# Patient Record
Sex: Male | Born: 1935 | Race: White | Hispanic: No | Marital: Married | Smoking: Current every day smoker
Health system: Southern US, Community
[De-identification: ages and names within clinical notes are randomized; demographics above are authoritative.]

## PROBLEM LIST (undated history)

## (undated) DIAGNOSIS — I10 Essential (primary) hypertension: Secondary | ICD-10-CM

## (undated) DIAGNOSIS — Z87442 Personal history of urinary calculi: Secondary | ICD-10-CM

## (undated) DIAGNOSIS — E785 Hyperlipidemia, unspecified: Secondary | ICD-10-CM

## (undated) DIAGNOSIS — E559 Vitamin D deficiency, unspecified: Secondary | ICD-10-CM

## (undated) HISTORY — DX: Vitamin D deficiency, unspecified: E55.9

## (undated) HISTORY — PX: LITHOTRIPSY: SUR834

## (undated) HISTORY — PX: SKIN CANCER EXCISION: SHX779

## (undated) HISTORY — PX: OTHER SURGICAL HISTORY: SHX169

---

## 1998-08-23 ENCOUNTER — Encounter: Payer: Self-pay | Admitting: Internal Medicine

## 1998-08-23 ENCOUNTER — Ambulatory Visit (HOSPITAL_COMMUNITY): Admission: RE | Admit: 1998-08-23 | Discharge: 1998-08-23 | Payer: Self-pay | Admitting: Internal Medicine

## 1998-12-05 ENCOUNTER — Encounter: Payer: Self-pay | Admitting: Urology

## 1998-12-05 ENCOUNTER — Ambulatory Visit (HOSPITAL_COMMUNITY): Admission: RE | Admit: 1998-12-05 | Discharge: 1998-12-05 | Payer: Self-pay | Admitting: Urology

## 1998-12-07 ENCOUNTER — Ambulatory Visit (HOSPITAL_COMMUNITY): Admission: RE | Admit: 1998-12-07 | Discharge: 1998-12-07 | Payer: Self-pay | Admitting: Urology

## 1998-12-07 ENCOUNTER — Encounter: Payer: Self-pay | Admitting: Urology

## 1999-09-05 ENCOUNTER — Ambulatory Visit (HOSPITAL_COMMUNITY): Admission: RE | Admit: 1999-09-05 | Discharge: 1999-09-05 | Payer: Self-pay | Admitting: Internal Medicine

## 1999-09-05 ENCOUNTER — Encounter: Payer: Self-pay | Admitting: Internal Medicine

## 2000-09-30 ENCOUNTER — Ambulatory Visit (HOSPITAL_COMMUNITY): Admission: RE | Admit: 2000-09-30 | Discharge: 2000-09-30 | Payer: Self-pay | Admitting: Internal Medicine

## 2000-09-30 ENCOUNTER — Encounter: Payer: Self-pay | Admitting: Internal Medicine

## 2001-10-06 ENCOUNTER — Ambulatory Visit (HOSPITAL_COMMUNITY): Admission: RE | Admit: 2001-10-06 | Discharge: 2001-10-06 | Payer: Self-pay | Admitting: Internal Medicine

## 2001-10-06 ENCOUNTER — Encounter: Payer: Self-pay | Admitting: Internal Medicine

## 2002-11-22 ENCOUNTER — Encounter: Payer: Self-pay | Admitting: Internal Medicine

## 2002-11-22 ENCOUNTER — Ambulatory Visit (HOSPITAL_COMMUNITY): Admission: RE | Admit: 2002-11-22 | Discharge: 2002-11-22 | Payer: Self-pay | Admitting: Internal Medicine

## 2003-03-22 ENCOUNTER — Ambulatory Visit (HOSPITAL_COMMUNITY): Admission: RE | Admit: 2003-03-22 | Discharge: 2003-03-22 | Payer: Self-pay | Admitting: Oncology

## 2003-03-22 ENCOUNTER — Encounter: Payer: Self-pay | Admitting: Oncology

## 2003-11-28 ENCOUNTER — Ambulatory Visit (HOSPITAL_COMMUNITY): Admission: RE | Admit: 2003-11-28 | Discharge: 2003-11-28 | Payer: Self-pay | Admitting: Internal Medicine

## 2004-12-03 ENCOUNTER — Ambulatory Visit: Payer: Self-pay | Admitting: Oncology

## 2005-06-13 ENCOUNTER — Ambulatory Visit: Payer: Self-pay | Admitting: Oncology

## 2005-11-28 ENCOUNTER — Ambulatory Visit: Payer: Self-pay | Admitting: Oncology

## 2005-12-03 ENCOUNTER — Ambulatory Visit (HOSPITAL_COMMUNITY): Admission: RE | Admit: 2005-12-03 | Discharge: 2005-12-03 | Payer: Self-pay | Admitting: Internal Medicine

## 2006-01-16 ENCOUNTER — Ambulatory Visit: Payer: Self-pay | Admitting: Oncology

## 2006-02-06 ENCOUNTER — Ambulatory Visit (HOSPITAL_COMMUNITY): Admission: RE | Admit: 2006-02-06 | Discharge: 2006-02-06 | Payer: Self-pay | Admitting: Urology

## 2006-06-23 ENCOUNTER — Ambulatory Visit: Payer: Self-pay | Admitting: Oncology

## 2006-06-26 LAB — COMPREHENSIVE METABOLIC PANEL
ALT: 25 U/L (ref 0–40)
AST: 29 U/L (ref 0–37)
Alkaline Phosphatase: 41 U/L (ref 39–117)
Chloride: 106 mEq/L (ref 96–112)
Creatinine, Ser: 1.04 mg/dL (ref 0.40–1.50)
Total Bilirubin: 0.6 mg/dL (ref 0.3–1.2)

## 2006-06-26 LAB — CBC WITH DIFFERENTIAL/PLATELET
BASO%: 0.6 % (ref 0.0–2.0)
EOS%: 2.5 % (ref 0.0–7.0)
LYMPH%: 38.7 % (ref 14.0–48.0)
MCH: 33 pg (ref 28.0–33.4)
MCHC: 33.8 g/dL (ref 32.0–35.9)
MONO#: 0.5 10*3/uL (ref 0.1–0.9)
MONO%: 8.2 % (ref 0.0–13.0)
Platelets: 141 10*3/uL — ABNORMAL LOW (ref 145–400)
RBC: 4.66 10*6/uL (ref 4.20–5.71)
WBC: 5.6 10*3/uL (ref 4.0–10.0)

## 2006-06-26 LAB — CHCC SMEAR

## 2006-12-11 ENCOUNTER — Ambulatory Visit (HOSPITAL_COMMUNITY): Admission: RE | Admit: 2006-12-11 | Discharge: 2006-12-11 | Payer: Self-pay | Admitting: Internal Medicine

## 2008-01-11 ENCOUNTER — Ambulatory Visit (HOSPITAL_COMMUNITY): Admission: RE | Admit: 2008-01-11 | Discharge: 2008-01-11 | Payer: Self-pay | Admitting: Internal Medicine

## 2009-03-27 ENCOUNTER — Ambulatory Visit (HOSPITAL_COMMUNITY): Admission: RE | Admit: 2009-03-27 | Discharge: 2009-03-27 | Payer: Self-pay | Admitting: Internal Medicine

## 2010-03-23 ENCOUNTER — Ambulatory Visit (HOSPITAL_COMMUNITY): Admission: RE | Admit: 2010-03-23 | Discharge: 2010-03-23 | Payer: Self-pay | Admitting: Internal Medicine

## 2010-08-02 ENCOUNTER — Ambulatory Visit (HOSPITAL_COMMUNITY): Admission: RE | Admit: 2010-08-02 | Discharge: 2010-08-02 | Payer: Self-pay | Admitting: Urology

## 2010-09-24 ENCOUNTER — Ambulatory Visit (HOSPITAL_COMMUNITY): Admission: RE | Admit: 2010-09-24 | Discharge: 2010-09-24 | Payer: Self-pay | Admitting: Urology

## 2011-10-01 IMAGING — CR DG ABDOMEN 1V
1 series · 1 of 1 positions shown · non-contrast
Comparison: 08/02/2010, CT 07/26/2010

CLINICAL DATA: Left-sided kidney stone, pre litho

ABDOMEN - 1 VIEW

[t abdomen supine]
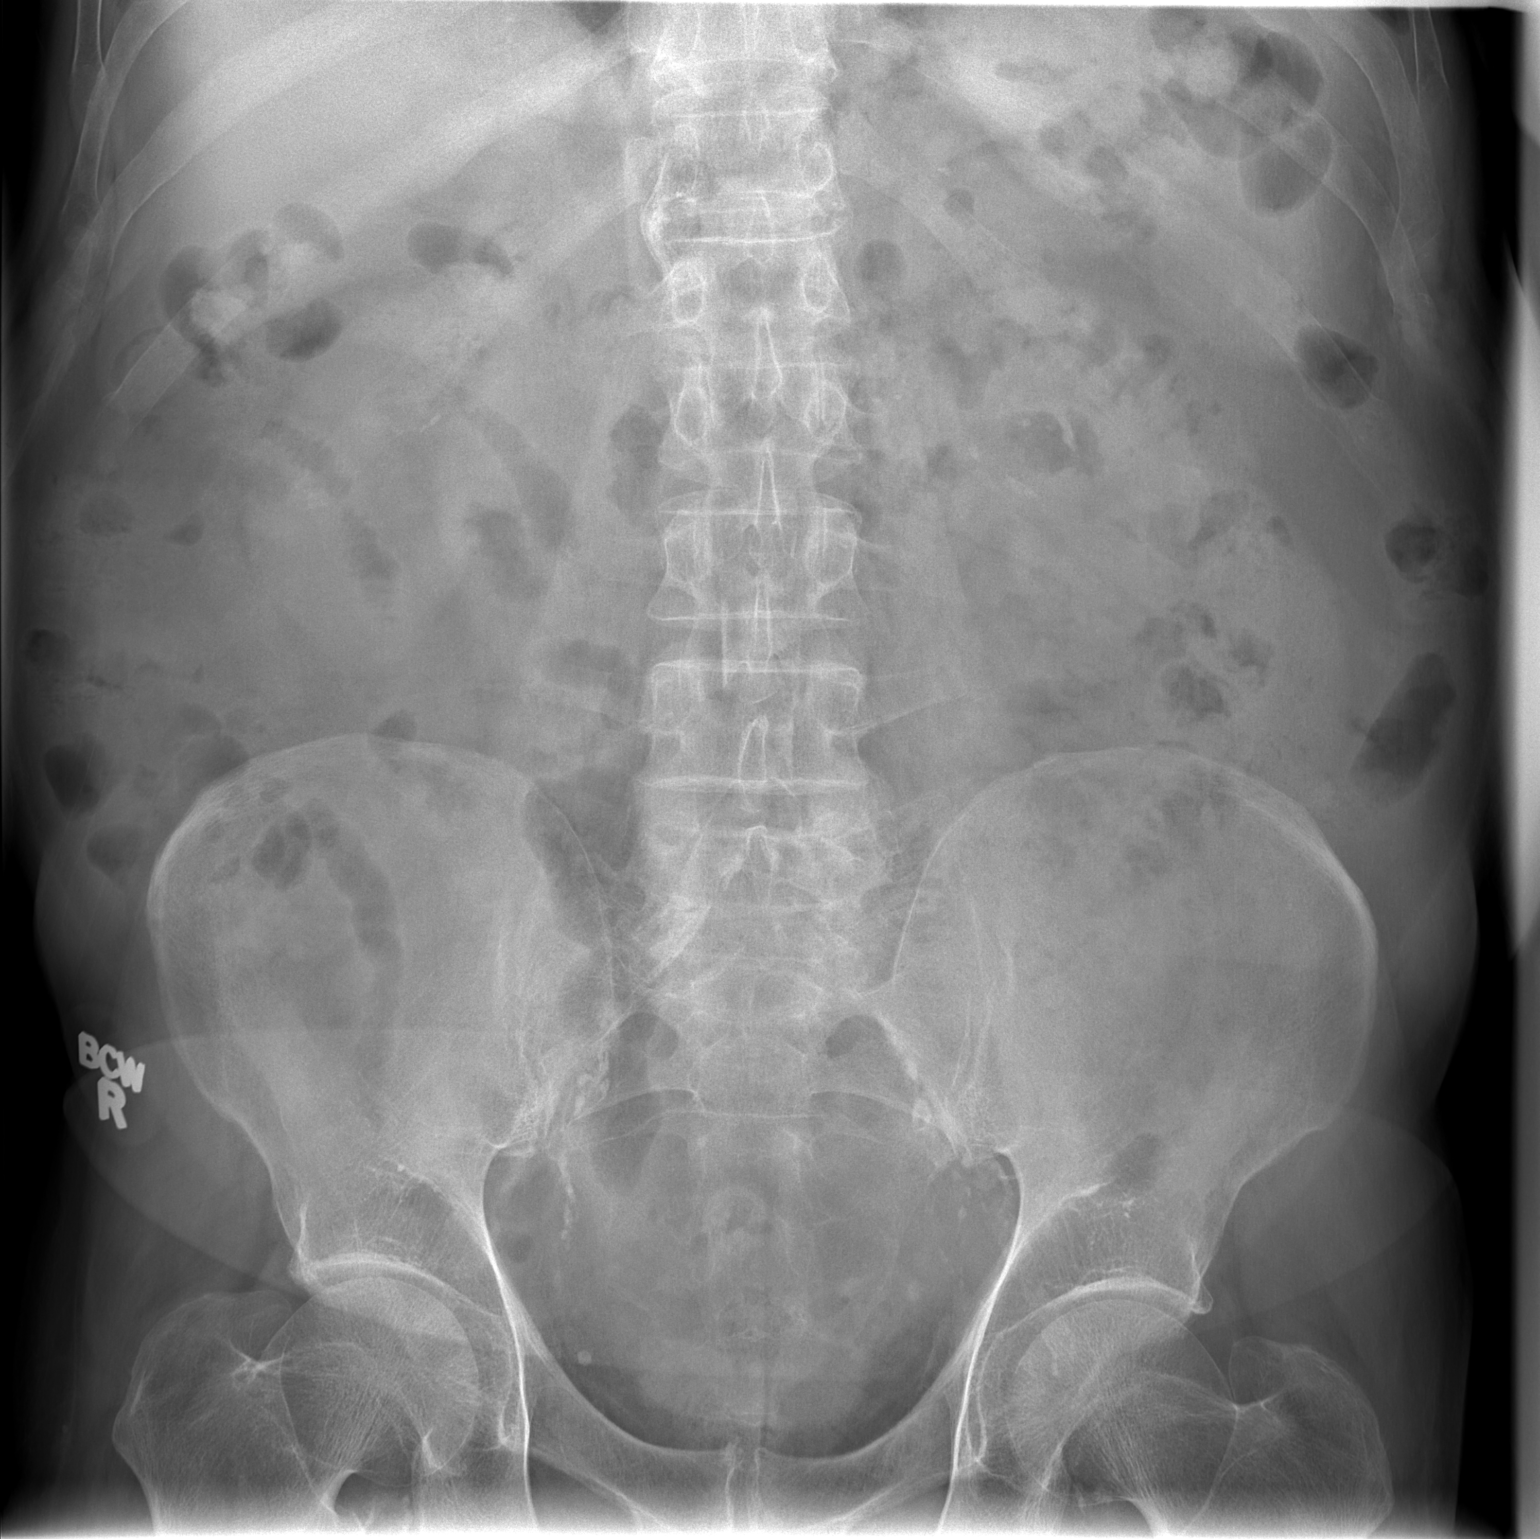

[1 of 1 positions shown; findings below may reference images not displayed]

FINDINGS: Multiple fragmented calcifications in the left pelvis
seen on the prior KUB have resolved may have passed.  This may be
fragmented calculus due to prior lithotripsy.  Bilateral calcified
phleboliths are noted as well as arterial calcification in the
pelvis.

Small bilateral renal calculi are present and unchanged.

Normal bowel gas pattern.
IMPRESSION: Small bilateral renal calculi.

Fragmented calculi in the distal left ureter have passed since the
prior KUB.

## 2012-04-06 ENCOUNTER — Other Ambulatory Visit (HOSPITAL_COMMUNITY): Payer: Self-pay | Admitting: Internal Medicine

## 2012-04-06 ENCOUNTER — Ambulatory Visit (HOSPITAL_COMMUNITY)
Admission: RE | Admit: 2012-04-06 | Discharge: 2012-04-06 | Disposition: A | Discharge status: Home / Self Care | Payer: Medicare Other | Source: Ambulatory Visit | Attending: Internal Medicine | Admitting: Internal Medicine

## 2012-04-06 DIAGNOSIS — I10 Essential (primary) hypertension: Secondary | ICD-10-CM

## 2012-04-06 DIAGNOSIS — F17200 Nicotine dependence, unspecified, uncomplicated: Secondary | ICD-10-CM | POA: Insufficient documentation

## 2013-02-17 ENCOUNTER — Other Ambulatory Visit: Payer: Self-pay | Admitting: Urology

## 2013-02-26 ENCOUNTER — Encounter (HOSPITAL_COMMUNITY): Payer: Self-pay | Admitting: Pharmacy Technician

## 2013-03-03 ENCOUNTER — Encounter (HOSPITAL_COMMUNITY)
Admission: RE | Admit: 2013-03-03 | Discharge: 2013-03-03 | Disposition: A | Discharge status: Home / Self Care | Payer: Medicare Other | Source: Ambulatory Visit | Attending: Urology | Admitting: Urology

## 2013-03-03 ENCOUNTER — Encounter (HOSPITAL_COMMUNITY): Payer: Self-pay

## 2013-03-03 HISTORY — DX: Essential (primary) hypertension: I10

## 2013-03-03 HISTORY — DX: Hyperlipidemia, unspecified: E78.5

## 2013-03-03 HISTORY — DX: Personal history of urinary calculi: Z87.442

## 2013-03-03 LAB — CBC
HCT: 48.6 % (ref 39.0–52.0)
Hemoglobin: 15.9 g/dL (ref 13.0–17.0)
MCHC: 32.7 g/dL (ref 30.0–36.0)
MCV: 101.5 fL — ABNORMAL HIGH (ref 78.0–100.0)
RDW: 13.2 % (ref 11.5–15.5)

## 2013-03-03 LAB — SURGICAL PCR SCREEN: Staphylococcus aureus: NEGATIVE

## 2013-03-03 LAB — BASIC METABOLIC PANEL
CO2: 29 mEq/L (ref 19–32)
Chloride: 104 mEq/L (ref 96–112)
Creatinine, Ser: 1.16 mg/dL (ref 0.50–1.35)
Glucose, Bld: 102 mg/dL — ABNORMAL HIGH (ref 70–99)

## 2013-03-03 NOTE — Patient Instructions (Addendum)
PANKAJ HAACK  03/03/2013                           YOUR PROCEDURE IS SCHEDULED ON: 03/11/13               PLEASE REPORT TO SHORT STAY CENTER AT : 6:30 AM               CALL THIS NUMBER IF ANY PROBLEMS THE DAY OF SURGERY :               [phone]                      REMEMBER:   Do not eat food or drink liquids AFTER MIDNIGHT    Take these medicines the morning of surgery with A SIP OF WATER:  FLOMAX / CRESTOR / MYRBETRIQ / TRICOR Gerald Leitz / FINESTERIDE (DO NOT TAKE ZIAC THE MORNING OF SURGERY)   Do not wear jewelry, make-up   Do not wear lotions, powders, or perfumes.   Do not shave legs or underarms 12 hrs. before surgery (men may shave face)  Do not bring valuables to the hospital.  Contacts, dentures or bridgework may not be worn into surgery.  Leave suitcase in the car. After surgery it may be brought to your room.  For patients admitted to the hospital more than one night, checkout time is 11:00                          The day of discharge.   Patients discharged the day of surgery will not be allowed to drive home                             If going home same day of surgery, must have someone stay with you first                           24 hrs at home and arrange for some one to drive you home from hospital.    Special Instructions:   Please read over the following fact sheets that you were given:               1. MRSA  INFORMATION                      2. [facility] PREPARING FOR SURGERY SHEET                                                X_____________________________________________________________________        Failure to follow these instructions may result in cancellation of your surgery

## 2013-03-10 NOTE — H&P (Signed)
ctive Problems Problems  1. Benign Prostatic Hypertrophy With Urinary Obstruction 600.01 2. Bladder Calculus 594.1 3. Feelings Of Urinary Urgency 788.63 4. Nephrolithiasis 592.0 5. Renal Cyst Bilateral 593.2 6. Urge Incontinence Of Urine 788.31  History of Present Illness  [name] returns today in f/u.  He has BPH with BOO and UUI.  He is currently on tamsulosin, finasteride and myrbetriq 50mg .  He has improved continence.  He might have one day a month when he has increased frequency.  He has nocturia 1-2x.  He also has a history of stones and has had 3 prior ESWL's.  He has had no flank pain or hematuria.   Past Medical History Problems  1. History of  Abdominal Pain Above The Pubic Area (Suprapubic) 789.09 2. History of  Acute Myocardial Infarction V12.59 3. History of  Distal Ureteral Stone On The Left 592.1 4. History of  Hydronephrosis On The Left 591 5. History of  Hypertension 401.9 6. History of  Thrombocytopenia 287.5  Surgical History Problems  1. History of  Lithotripsy 2. History of  Lithotripsy 3. History of  Lithotripsy  Current Meds 1. ALPRAZolam 0.5 MG Oral Tablet; Therapy: 10Sep2007 to 2. Aspirin 81 MG Oral Tablet; Therapy: (Recorded:28Jul2008) to 3. Bisoprolol-Hydrochlorothiazide 5-6.25 MG Oral Tablet; Therapy: 10Jan2011 to 4. Crestor 40 MG Oral Tablet; Therapy: (Recorded:28Jul2008) to 5. Fenofibrate Micronized 134 MG Oral Capsule; Therapy: 07Jul2010 to 6. Finasteride 5 MG Oral Tablet; Therapy: 27Feb2013 to 7. Folic Acid TABS; Therapy: (Recorded:11Jun2012) to 8. Myrbetriq 50 MG Oral Tablet Extended Release 24 Hour; Take 1 tablet daily; Therapy: 30Aug2013  to (Evaluate:26Feb2014)  Requested for: 30Aug2013; Last Rx:30Aug2013 9. Vitamin B-12 TABS; Therapy: (Recorded:28Jan2008) to 10. Vitamin D TABS; Therapy: (Recorded:12Feb2014) to  Allergies Medication  1. No Known Drug Allergies  Family History Problems  1. Family history of  Diabetes Mellitus  V18.0 Denied  2. Family history of  Nephrolithiasis  Social History Problems  1. Alcohol Use moderate 2. Caffeine Use a little 3. Family history of  Death In The Family Father 17, natural 4. Family history of  Death In The Family Mother 68, natural 5. Marital History - Currently Married 6. Tobacco Use V15.82 has smoked for 58 years   Past and social history reviewed and updated.   Review of Systems Genitourinary, constitutional, skin, eye, otolaryngeal, hematologic/lymphatic, cardiovascular, pulmonary, endocrine, musculoskeletal, gastrointestinal, neurological and psychiatric system(s) were reviewed and pertinent findings if present are noted.  Genitourinary: no hematuria.  Gastrointestinal: no nausea, no flank pain and no constipation.  ENT: dry mouth .  Cardiovascular: no chest pain.  Respiratory: no shortness of breath.  Endocrine: no gynecosmastia.  Neurological: no headache.    Vitals Vital Signs [Data Includes: Last 1 Day]  12Feb2014 11:11AM  BMI Calculated: 25.43 BSA Calculated: 1.85 Weight: 162 lb  Blood Pressure: 142 / 84 Temperature: 98 F Heart Rate: 76  Physical Exam Constitutional: Well nourished and well developed . No acute distress.  Pulmonary: No respiratory distress and normal respiratory rhythm and effort.  Cardiovascular: Heart rate and rhythm are normal . No peripheral edema.    Results/Data Urine [Data Includes: Last 1 Day]   12Feb2014  COLOR YELLOW   APPEARANCE CLEAR   SPECIFIC GRAVITY 1.020   pH 7.0   GLUCOSE NEG mg/dL  BILIRUBIN NEG   KETONE NEG mg/dL  BLOOD NEG   PROTEIN NEG mg/dL  UROBILINOGEN 2 mg/dL  NITRITE NEG   LEUKOCYTE ESTERASE NEG    The following images/tracing/specimen were independently visualized:  KUB  today shows some small renal stones but he has a new 12mm calcification over the bladder that is consistent with a bladder stone. The films is otherwise stable in comparision to his prior films. Bladder US today confirms  a 1.2cm bladder stone. The bladder volume is 20cc.  IPSS: The IPSS today is 10     Assessment Assessed  1. Benign Prostatic Hypertrophy With Urinary Obstruction 600.01 2. Feelings Of Urinary Urgency 788.63 3. Urge Incontinence Of Urine 788.31 4. Nephrolithiasis 592.0 5. Bladder Calculus 594.1      He is doing well on current therapy. He has no stone symptoms but he has a new finding of a 12mm bladder stone. .   Plan Bladder Calculus (594.1)  1. BLADDER U/S  Done: 12Feb2014 12:00AM 2. Follow-up Schedule Surgery Office  Follow-up  Requested for: 12Feb2014 Health Maintenance (V70.0)  3. UA With REFLEX  Done: 12Feb2014 10:48AM Urge Incontinence Of Urine (788.31)  4. Myrbetriq 50 MG Oral Tablet Extended Release 24 Hour; Take 1 tablet daily; Therapy: 30Aug2013  to (Evaluate:07Feb2015)  Requested for: 12Feb2014; Last Rx:12Feb2014; Edited     I discussed surgical options for his bladder stone and voiding symptoms. I am going to set him up for cystolithalopaxy and TURP/TUEVP.  I have reviewed the risks of bleeding, infection, strictures, incontinence, persistant voiding symptoms, thrombotic events and anesthetic complications. I have refilled the Myrbetriq.   Discussion/Summary  CC: Dr. Lucky Cowboy

## 2013-03-11 ENCOUNTER — Ambulatory Visit (HOSPITAL_COMMUNITY): Payer: Medicare Other | Admitting: Anesthesiology

## 2013-03-11 ENCOUNTER — Encounter (HOSPITAL_COMMUNITY): Payer: Self-pay | Admitting: Certified Registered Nurse Anesthetist

## 2013-03-11 ENCOUNTER — Encounter (HOSPITAL_COMMUNITY)
Admission: RE | Disposition: A | Discharge status: Home / Self Care | Payer: Self-pay | Source: Ambulatory Visit | Attending: Urology

## 2013-03-11 ENCOUNTER — Observation Stay (HOSPITAL_COMMUNITY)
Admission: RE | Admit: 2013-03-11 | Discharge: 2013-03-12 | Disposition: A | Discharge status: Home / Self Care | Payer: Medicare Other | Source: Ambulatory Visit | Attending: Urology | Admitting: Urology

## 2013-03-11 ENCOUNTER — Encounter (HOSPITAL_COMMUNITY): Payer: Self-pay | Admitting: Anesthesiology

## 2013-03-11 DIAGNOSIS — N320 Bladder-neck obstruction: Secondary | ICD-10-CM | POA: Insufficient documentation

## 2013-03-11 DIAGNOSIS — I10 Essential (primary) hypertension: Secondary | ICD-10-CM | POA: Insufficient documentation

## 2013-03-11 DIAGNOSIS — Z7982 Long term (current) use of aspirin: Secondary | ICD-10-CM | POA: Insufficient documentation

## 2013-03-11 DIAGNOSIS — I252 Old myocardial infarction: Secondary | ICD-10-CM | POA: Insufficient documentation

## 2013-03-11 DIAGNOSIS — N138 Other obstructive and reflux uropathy: Principal | ICD-10-CM | POA: Insufficient documentation

## 2013-03-11 DIAGNOSIS — N281 Cyst of kidney, acquired: Secondary | ICD-10-CM | POA: Insufficient documentation

## 2013-03-11 DIAGNOSIS — N210 Calculus in bladder: Secondary | ICD-10-CM | POA: Insufficient documentation

## 2013-03-11 DIAGNOSIS — R3915 Urgency of urination: Secondary | ICD-10-CM | POA: Insufficient documentation

## 2013-03-11 DIAGNOSIS — N3941 Urge incontinence: Secondary | ICD-10-CM | POA: Insufficient documentation

## 2013-03-11 DIAGNOSIS — N401 Enlarged prostate with lower urinary tract symptoms: Secondary | ICD-10-CM | POA: Insufficient documentation

## 2013-03-11 DIAGNOSIS — Z79899 Other long term (current) drug therapy: Secondary | ICD-10-CM | POA: Insufficient documentation

## 2013-03-11 DIAGNOSIS — N200 Calculus of kidney: Secondary | ICD-10-CM | POA: Insufficient documentation

## 2013-03-11 HISTORY — PX: HOLMIUM LASER APPLICATION: SHX5852

## 2013-03-11 HISTORY — PX: TRANSURETHRAL RESECTION OF PROSTATE: SHX73

## 2013-03-11 HISTORY — PX: CYSTOSCOPY WITH LITHOLAPAXY: SHX1425

## 2013-03-11 SURGERY — CYSTOSCOPY, WITH BLADDER CALCULUS LITHOLAPAXY
Anesthesia: General | Wound class: Clean Contaminated

## 2013-03-11 MED ORDER — VITAMIN D3 25 MCG (1000 UNIT) PO TABS
1000.0000 [IU] | ORAL_TABLET | Freq: Every day | ORAL | Status: DC
  Administered 2013-03-11 – 2013-03-12 (×2): 1000 [IU] via ORAL
  Filled 2013-03-11 (×2): qty 1

## 2013-03-11 MED ORDER — CIPROFLOXACIN HCL 250 MG PO TABS
250.0000 mg | ORAL_TABLET | Freq: Two times a day (BID) | ORAL | Status: DC
  Administered 2013-03-11 – 2013-03-12 (×2): 250 mg via ORAL
  Filled 2013-03-11 (×4): qty 1

## 2013-03-11 MED ORDER — PROPOFOL 10 MG/ML IV BOLUS
INTRAVENOUS | Status: DC | PRN
  Administered 2013-03-11: 140 mg via INTRAVENOUS

## 2013-03-11 MED ORDER — CIPROFLOXACIN IN D5W 400 MG/200ML IV SOLN
INTRAVENOUS | Status: AC
  Filled 2013-03-11: qty 200

## 2013-03-11 MED ORDER — DOCUSATE SODIUM 100 MG PO CAPS
100.0000 mg | ORAL_CAPSULE | Freq: Two times a day (BID) | ORAL | Status: DC
  Administered 2013-03-11 – 2013-03-12 (×2): 100 mg via ORAL
  Filled 2013-03-11 (×4): qty 1

## 2013-03-11 MED ORDER — FENOFIBRATE 54 MG PO TABS
54.0000 mg | ORAL_TABLET | Freq: Every day | ORAL | Status: DC
  Administered 2013-03-12: 54 mg via ORAL
  Filled 2013-03-11: qty 1

## 2013-03-11 MED ORDER — ACETAMINOPHEN 10 MG/ML IV SOLN
INTRAVENOUS | Status: DC | PRN
  Administered 2013-03-11: 1000 mg via INTRAVENOUS

## 2013-03-11 MED ORDER — STERILE WATER FOR IRRIGATION IR SOLN
Status: DC | PRN
  Administered 2013-03-11: 500 mL

## 2013-03-11 MED ORDER — HYDROMORPHONE HCL PF 1 MG/ML IJ SOLN
0.2500 mg | INTRAMUSCULAR | Status: DC | PRN
  Administered 2013-03-11 (×3): 0.5 mg via INTRAVENOUS

## 2013-03-11 MED ORDER — BISACODYL 10 MG RE SUPP: 10.0000 mg | Freq: Every day | RECTAL | Status: DC | PRN

## 2013-03-11 MED ORDER — CIPROFLOXACIN IN D5W 400 MG/200ML IV SOLN
400.0000 mg | INTRAVENOUS | Status: AC
  Administered 2013-03-11: 400 mg via INTRAVENOUS

## 2013-03-11 MED ORDER — KCL IN DEXTROSE-NACL 20-5-0.45 MEQ/L-%-% IV SOLN
INTRAVENOUS | Status: DC
  Administered 2013-03-11 (×2): via INTRAVENOUS
  Filled 2013-03-11 (×3): qty 1000

## 2013-03-11 MED ORDER — MIRABEGRON ER 50 MG PO TB24
50.0000 mg | ORAL_TABLET | Freq: Every day | ORAL | Status: DC
  Filled 2013-03-11: qty 1

## 2013-03-11 MED ORDER — ONDANSETRON HCL 4 MG/2ML IJ SOLN: 4.0000 mg | INTRAMUSCULAR | Status: DC | PRN

## 2013-03-11 MED ORDER — HYDROCODONE-ACETAMINOPHEN 5-325 MG PO TABS
1.0000 | ORAL_TABLET | Freq: Four times a day (QID) | ORAL | Status: DC | PRN
Start: 2013-03-11 — End: 2014-01-14

## 2013-03-11 MED ORDER — HYDROMORPHONE HCL PF 1 MG/ML IJ SOLN
INTRAMUSCULAR | Status: AC
  Filled 2013-03-11: qty 1

## 2013-03-11 MED ORDER — CIPROFLOXACIN HCL 250 MG PO TABS: 250.0000 mg | ORAL_TABLET | Freq: Two times a day (BID) | ORAL | Status: AC

## 2013-03-11 MED ORDER — LACTATED RINGERS IV SOLN
INTRAVENOUS | Status: DC
  Administered 2013-03-11: 1000 mL via INTRAVENOUS
  Administered 2013-03-11 (×2): via INTRAVENOUS

## 2013-03-11 MED ORDER — 0.9 % SODIUM CHLORIDE (POUR BTL) OPTIME
TOPICAL | Status: DC | PRN
  Administered 2013-03-11: 1000 mL

## 2013-03-11 MED ORDER — SODIUM CHLORIDE 0.9 % IR SOLN
Status: DC | PRN
  Administered 2013-03-11: 12000 mL

## 2013-03-11 MED ORDER — PROMETHAZINE HCL 25 MG/ML IJ SOLN: 6.2500 mg | INTRAMUSCULAR | Status: DC | PRN

## 2013-03-11 MED ORDER — BISOPROLOL-HYDROCHLOROTHIAZIDE 5-6.25 MG PO TABS
1.0000 | ORAL_TABLET | Freq: Every morning | ORAL | Status: DC
  Administered 2013-03-12: 1 via ORAL
  Filled 2013-03-11: qty 1

## 2013-03-11 MED ORDER — ACETAMINOPHEN 10 MG/ML IV SOLN
INTRAVENOUS | Status: AC
  Filled 2013-03-11: qty 100

## 2013-03-11 MED ORDER — BISOPROLOL FUMARATE 5 MG PO TABS
5.0000 mg | ORAL_TABLET | Freq: Once | ORAL | Status: AC
  Administered 2013-03-11: 5 mg via ORAL
  Filled 2013-03-11 (×2): qty 1

## 2013-03-11 MED ORDER — HYOSCYAMINE SULFATE 0.125 MG SL SUBL
0.1250 mg | SUBLINGUAL_TABLET | SUBLINGUAL | Status: DC | PRN
  Administered 2013-03-11: 0.125 mg via ORAL
  Filled 2013-03-11: qty 1

## 2013-03-11 MED ORDER — ATORVASTATIN CALCIUM 80 MG PO TABS
80.0000 mg | ORAL_TABLET | Freq: Every day | ORAL | Status: DC
  Filled 2013-03-11: qty 1

## 2013-03-11 MED ORDER — ONDANSETRON HCL 4 MG/2ML IJ SOLN
INTRAMUSCULAR | Status: DC | PRN
  Administered 2013-03-11: 4 mg via INTRAVENOUS

## 2013-03-11 MED ORDER — DOCUSATE SODIUM 100 MG PO CAPS: 100.0000 mg | ORAL_CAPSULE | Freq: Two times a day (BID) | ORAL | Status: DC

## 2013-03-11 MED ORDER — ZOLPIDEM TARTRATE 5 MG PO TABS: 5.0000 mg | ORAL_TABLET | Freq: Every evening | ORAL | Status: DC | PRN

## 2013-03-11 MED ORDER — HYDROCODONE-ACETAMINOPHEN 5-325 MG PO TABS
1.0000 | ORAL_TABLET | ORAL | Status: DC | PRN
  Administered 2013-03-11: 2 via ORAL
  Filled 2013-03-11: qty 2

## 2013-03-11 MED ORDER — HYDROMORPHONE HCL PF 1 MG/ML IJ SOLN: 0.5000 mg | INTRAMUSCULAR | Status: DC | PRN

## 2013-03-11 MED ORDER — FENTANYL CITRATE 0.05 MG/ML IJ SOLN
INTRAMUSCULAR | Status: DC | PRN
  Administered 2013-03-11 (×3): 50 ug via INTRAVENOUS
  Administered 2013-03-11: 100 ug via INTRAVENOUS
  Administered 2013-03-11: 50 ug via INTRAVENOUS

## 2013-03-11 MED ORDER — ACETAMINOPHEN 325 MG PO TABS: 650.0000 mg | ORAL_TABLET | ORAL | Status: DC | PRN

## 2013-03-11 MED ORDER — ALPRAZOLAM 0.5 MG PO TABS: 0.5000 mg | ORAL_TABLET | Freq: Every day | ORAL | Status: DC | PRN

## 2013-03-11 MED ORDER — LACTATED RINGERS IV SOLN: INTRAVENOUS | Status: DC

## 2013-03-11 SURGICAL SUPPLY — 32 items
BAG URINE DRAINAGE (UROLOGICAL SUPPLIES) ×1 IMPLANT
BAG URO CATCHER STRL LF (DRAPE) ×2 IMPLANT
BLADE SURG 15 STRL LF DISP TIS (BLADE) IMPLANT
BLADE SURG 15 STRL SS (BLADE)
CATH FOLEY 2WAY SLVR 30CC 20FR (CATHETERS) ×1 IMPLANT
CATH FOLEY 3WAY 30CC 22FR (CATHETERS) IMPLANT
CATH URET 5FR 28IN OPEN ENDED (CATHETERS) ×1 IMPLANT
CLOTH BEACON ORANGE TIMEOUT ST (SAFETY) ×2 IMPLANT
DRAPE CAMERA CLOSED 9X96 (DRAPES) ×2 IMPLANT
ELECT BUTTON HF 24-28F 2 30DE (ELECTRODE) ×1 IMPLANT
ELECT HF RESECT BIPO 24F 45 ND (CUTTING LOOP) ×1 IMPLANT
ELECT LOOP MED HF 24F 12D (CUTTING LOOP) ×1 IMPLANT
ELECT REM PT RETURN 9FT ADLT (ELECTROSURGICAL)
ELECT RESECT VAPORIZE 12D CBL (ELECTRODE) ×1 IMPLANT
ELECTRODE REM PT RTRN 9FT ADLT (ELECTROSURGICAL) ×1 IMPLANT
GLOVE SURG SS PI 8.0 STRL IVOR (GLOVE) ×2 IMPLANT
GOWN PREVENTION PLUS XLARGE (GOWN DISPOSABLE) ×1 IMPLANT
GOWN STRL REIN XL XLG (GOWN DISPOSABLE) ×2 IMPLANT
HOLDER FOLEY CATH W/STRAP (MISCELLANEOUS) IMPLANT
IV NS IRRIG 3000ML ARTHROMATIC (IV SOLUTION) ×4 IMPLANT
KIT ASPIRATION TUBING (SET/KITS/TRAYS/PACK) ×2 IMPLANT
LASER FIBER DISP (UROLOGICAL SUPPLIES) IMPLANT
LASER FIBER DISP 1000U (UROLOGICAL SUPPLIES) ×1 IMPLANT
MANIFOLD NEPTUNE II (INSTRUMENTS) ×2 IMPLANT
MARKER SKIN DUAL TIP RULER LAB (MISCELLANEOUS) ×1 IMPLANT
PACK CYSTO (CUSTOM PROCEDURE TRAY) ×2 IMPLANT
PROBE EHL 9 FR 470CM (MISCELLANEOUS) IMPLANT
SHIELD EYE BINOCULAR (MISCELLANEOUS) ×1 IMPLANT
SUT ETHILON 3 0 PS 1 (SUTURE) IMPLANT
SYR 30ML LL (SYRINGE) ×1 IMPLANT
SYRINGE IRR TOOMEY STRL 70CC (SYRINGE) ×1 IMPLANT
TUBING CONNECTING 10 (TUBING) ×2 IMPLANT

## 2013-03-11 NOTE — Anesthesia Postprocedure Evaluation (Signed)
Anesthesia Post Note  Patient: Bobby Johnson  Procedure(s) Performed: Procedure(s) (LRB): CYSTOSCOPY WITH LITHOLAPAXY (N/A) HOLMIUM LASER APPLICATION (N/A) TRANSURETHRAL RESECTION OF THE PROSTATE WITH GYRUS INSTRUMENTS (N/A)  Anesthesia type: General  Patient location: PACU  Post pain: Pain level controlled  Post assessment: Post-op Vital signs reviewed  Last Vitals:  Filed Vitals:   03/11/13 1045  BP: 118/71  Pulse: 66  Temp:   Resp: 14    Post vital signs: Reviewed  Level of consciousness: sedated  Complications: No apparent anesthesia complications

## 2013-03-11 NOTE — Anesthesia Preprocedure Evaluation (Addendum)
Anesthesia Evaluation  Patient identified by MRN, date of birth, ID band Patient awake    Reviewed: Allergy & Precautions, H&P , NPO status , Patient's Chart, lab work & pertinent test results  Airway Mallampati: II TM Distance: >3 FB Neck ROM: Full    Dental  (+) Edentulous Upper, Caps and Dental Advisory Given   Pulmonary Current Smoker,  breath sounds clear to auscultation  Pulmonary exam normal       Cardiovascular hypertension, Pt. on home beta blockers + CAD and + Past MI Rhythm:Regular Rate:Normal     Neuro/Psych negative neurological ROS  negative psych ROS   GI/Hepatic negative GI ROS, Neg liver ROS,   Endo/Other  negative endocrine ROS  Renal/GU negative Renal ROS  negative genitourinary   Musculoskeletal negative musculoskeletal ROS (+)   Abdominal   Peds  Hematology negative hematology ROS (+)   Anesthesia Other Findings   Reproductive/Obstetrics negative OB ROS                          Anesthesia Physical Anesthesia Plan  ASA: III  Anesthesia Plan: General   Post-op Pain Management:    Induction: Intravenous  Airway Management Planned: LMA  Additional Equipment:   Intra-op Plan:   Post-operative Plan: Extubation in OR  Informed Consent:   Dental advisory given  Plan Discussed with: CRNA  Anesthesia Plan Comments:         Anesthesia Quick Evaluation

## 2013-03-11 NOTE — Brief Op Note (Signed)
03/11/2013  10:05 AM  PATIENT:  Bobby Johnson  78 y.o. male  PRE-OPERATIVE DIAGNOSIS:  Bladder Stone and Benign Prostatic Hypertrophy with Bladder Outlet Obstruction  POST-OPERATIVE DIAGNOSIS:  Bladder Stone and Benign Prostatic Hypertrophy with Bladder Outlet Obstruction  PROCEDURE:  Procedure(s) with comments: CYSTOSCOPY WITH LITHOLAPAXY (N/A) - 90 mins requested for this case   HOLMIUM LASER APPLICATION (N/A) TRANSURETHRAL RESECTION OF THE PROSTATE WITH GYRUS INSTRUMENTS (N/A)  SURGEON:  Surgeon(s) and Role:    * Anner Crete, MD - Primary  PHYSICIAN ASSISTANT:   ASSISTANTS: none   ANESTHESIA:   general  EBL:  Total I/O In: 1000 [I.V.:1000] Out: -   BLOOD ADMINISTERED:none  DRAINS: Urinary Catheter (Foley)   LOCAL MEDICATIONS USED:  NONE  SPECIMEN:  Source of Specimen:  stone fragments  DISPOSITION OF SPECIMEN:  to family  COUNTS:  YES  TOURNIQUET:  * No tourniquets in log *  DICTATION: .Other Dictation: Dictation Number [id number]  PLAN OF CARE: Admit for overnight observation  PATIENT DISPOSITION:  PACU - hemodynamically stable.   Delay start of Pharmacological VTE agent (>24hrs) due to surgical blood loss or risk of bleeding: yes

## 2013-03-11 NOTE — Progress Notes (Signed)
Patient ID: Bobby Johnson, male   DOB: December 27, 1935, 78 y.o.   MRN: [id number]  Pt without complaints.  Urine light pink.

## 2013-03-11 NOTE — Transfer of Care (Signed)
Immediate Anesthesia Transfer of Care Note  Patient: Bobby Johnson  Procedure(s) Performed: Procedure(s) with comments: CYSTOSCOPY WITH LITHOLAPAXY (N/A) - 90 mins requested for this case   HOLMIUM LASER APPLICATION (N/A) TRANSURETHRAL RESECTION OF THE PROSTATE WITH GYRUS INSTRUMENTS (N/A)  Patient Location: PACU  Anesthesia Type:General  Level of Consciousness: awake and alert   Airway & Oxygen Therapy: Patient Spontanous Breathing and Patient connected to face mask oxygen  Post-op Assessment: Report given to PACU RN and Post -op Vital signs reviewed and stable  Post vital signs: Reviewed and stable  Complications: No apparent anesthesia complications

## 2013-03-11 NOTE — Interval H&P Note (Signed)
History and Physical Interval Note:  03/11/2013 8:39 AM  Bobby Johnson  has presented today for surgery, with the diagnosis of Bladder Stone and Benign Prostatic Hypertrophy with Bladder Outlet Obstruction  The various methods of treatment have been discussed with the patient and family. After consideration of risks, benefits and other options for treatment, the patient has consented to  Procedure(s) with comments: CYSTOSCOPY WITH LITHOLAPAXY (N/A) - 90 mins requested for this case   HOLMIUM LASER APPLICATION (N/A) TRANSURETHRAL RESECTION OF THE PROSTATE WITH GYRUS INSTRUMENTS (N/A) as a surgical intervention .  The patient's history has been reviewed, patient examined, no change in status, stable for surgery.  I have reviewed the patient's chart and labs.  Questions were answered to the patient's satisfaction.     WRENN,JOHN J

## 2013-03-12 ENCOUNTER — Encounter (HOSPITAL_COMMUNITY): Payer: Self-pay | Admitting: Urology

## 2013-03-12 NOTE — Op Note (Signed)
NAME:  SHARBEL, SAHAGUN NO.:  [id number]  MEDICAL RECORD NO.:  [id number]  LOCATION:  1421                         FACILITY:  [facility]  PHYSICIAN:  Excell Seltzer. Annabell Howells, M.D.    DATE OF BIRTH:  03/18/1935  DATE OF PROCEDURE:  03/11/2013 DATE OF DISCHARGE:                              OPERATIVE REPORT   PROCEDURE:  Cystolitholapaxy with holmium laser and transurethral electrovaporization of the prostate.  PREOPERATIVE DIAGNOSES:  Benign prostatic hypertrophy, bladder outlet obstruction, bladder stones.  POSTOPERATIVE DIAGNOSES:  Benign prostatic hypertrophy, bladder outlet obstruction, bladder stones.  SURGEON:  Excell Seltzer. Annabell Howells, M.D.  ANESTHESIA:  General.  SPECIMEN:  Stone fragments, which were given to the patient.  DRAINS:  A 20-French 30 mL Foley catheter.  BLOOD LOSS:  Minimal.  COMPLICATIONS:  None.  INDICATIONS:  Mr. Anthis is a 78 year old white male with BPH, bladder outlet obstruction, and approximately 1-cm bladder stone who has elected cystolitholapaxy and TURP for treatment.  FINDINGS AND PROCEDURE:  He was given Cipro.  He was taken to the operating room where general anesthetic was induced.  He was placed in lithotomy position and fitted with PAS hose.  His perineum and genitalia were prepped with Betadine solution and draped in usual sterile fashion.  Cystoscopy was performed using the 22-French scope and 12-degree lens. Examination revealed a normal urethra.  The external sphincter was intact.  The prostatic urethra was approximately 3 cm in length with bilobar hyperplasia with obstruction and also high bladder neck without a significant middle lobe.  Examination of the bladder revealed moderate trabeculation.  The ureteral orifices were unremarkable.  No tumors were noted, but there was a 1-cm stone in the base of the bladder.  A 1000 micron holmium laser fiber was passed.  The energy was set at 0.5 watts at a frequency of 20 Hz and the stone  was then fragmented readily into small fragments.  At this point, the stone fragments were evacuated through the cystoscope.  The cystoscope was removed and a 28-French continuous flow resectoscope sheath was inserted.  This was fitted with an [place] handle with a gyrus button, a 12-degrees lens, and saline was used as the irrigant.  A transurethral electrovaporization of the prostate was then performed beginning at the bladder neck where the bladder neck fibers were exposed from 5-7 o'clock.  The floor of the prostate was then vaporized out to alongside the verumontanum.  The left lobe of the prostate was vaporized from bladder neck to apex to grade an adequate channel.  This was then repeated on the right side.  Additional anterior tissue and apical tissue was removed as needed and once a generous channel was created, the lumen was fulgurated to ensure hemostasis.  The bladder was inspected and no residual stone under tissue fragments were remaining. Ureteral orifices were intact.  The resectoscope was then removed.  Pressure on the bladder produced an excellent stream.  A 20-French Foley catheter was placed with the aid of a catheter guide.  The balloon was filled with 30 mL of sterile fluid. The catheter was irrigated with clear return and placed to straight drainage.  The patient was taken  down from lithotomy position.  His anesthetic was reversed.  He was moved to the recovery room in a stable condition.  There were no complications.     Excell Seltzer. Annabell Howells, M.D.     JJW/MEDQ  D:  03/11/2013  T:  03/12/2013  Job:  [id number]

## 2013-03-12 NOTE — Progress Notes (Signed)
Discharge to home, wife at bedside, pt alert and oriented, no complaints of any pain or discomfort upon discharge. PIV removed no s/s of infiltration or swelling noted. D/c instructions and follow up appointment done and given to the wife.

## 2013-03-12 NOTE — Discharge Summary (Signed)
Physician Discharge Summary  Patient ID: Bobby Johnson MRN: [id number] DOB/AGE: July 16, 1935 78 y.o.  Admit date: 03/11/2013 Discharge date: 03/12/2013  Admission Diagnoses:  BPH with BOO and a bladder stone  Discharge Diagnoses: Same Active Problems: BPH with BOO and Bladder stone   Discharged Condition: good  Hospital Course: Mr. Hoheisel had a cystolithalopaxy and TUEVP yesterday.  He is doing well without complaints.  His foley was removed this morning and he has voided a small amount of pink urine.   Consults: None  Significant Diagnostic Studies: None  Treatments: surgery: as above  Discharge Exam: Blood pressure 106/69, pulse 67, temperature 97.9 F (36.6 C), temperature source Oral, resp. rate 16, height 5\' 8"  (1.727 m), weight 77.16 kg (170 lb 1.7 oz), SpO2 95.00%. General appearance: alert and no distress  Disposition:  Home   Discharge Orders   Future Orders Complete By Expires     Discontinue IV  As directed         Medication List    STOP taking these medications       finasteride 5 MG tablet  Commonly known as:  PROSCAR     MYRBETRIQ 50 MG Tb24  Generic drug:  mirabegron ER     tamsulosin 0.4 MG Caps  Commonly known as:  FLOMAX      TAKE these medications       ALPRAZolam 0.5 MG tablet  Commonly known as:  XANAX  Take 0.5 mg by mouth daily as needed for anxiety.     aspirin EC 81 MG tablet  Take 81 mg by mouth daily.     bisoprolol-hydrochlorothiazide 5-6.25 MG per tablet  Commonly known as:  ZIAC  Take 1 tablet by mouth every morning.     cholecalciferol 1000 UNITS tablet  Commonly known as:  VITAMIN D  Take 1,000 Units by mouth daily.     ciprofloxacin 250 MG tablet  Commonly known as:  CIPRO  Take 1 tablet (250 mg total) by mouth 2 (two) times daily.     docusate sodium 100 MG capsule  Commonly known as:  COLACE  Take 1 capsule (100 mg total) by mouth 2 (two) times daily.     fenofibrate micronized 134 MG capsule  Commonly known as:   LOFIBRA  Take 134 mg by mouth daily before breakfast.     HYDROcodone-acetaminophen 5-325 MG per tablet  Commonly known as:  NORCO  Take 1 tablet by mouth every 6 (six) hours as needed for pain.     rosuvastatin 40 MG tablet  Commonly known as:  CRESTOR  Take 40 mg by mouth every morning.           Follow-up Information   Follow up with Anner Crete, MD. Call in 3 weeks. (If you don't already have an appt)    Contact information:   [address] [place] [address] [phone]       Signed: Anner Crete 03/12/2013, 6:50 AM

## 2013-03-12 NOTE — Progress Notes (Addendum)
Foley removed this morning per Doctor order. Pt due to void at this time. Pt aware of the 3 string bottles in bathroom and verbalized understanding of collection procedure. Will follow up with day shift nurse, and continue to monitor patient per Doctors order and unit protocol.   Patient voided a small amount of pinkish colored urine. Dr. Annabell Howells in to see patient and placed new orders to discontinue IV fluids and to discharge patient. Will follow up with day shift nurse.

## 2013-03-12 NOTE — Care Management Note (Signed)
    Page 1 of 1   03/12/2013     11:30:03 AM   CARE MANAGEMENT NOTE 03/12/2013  Patient:  Bobby Johnson, Bobby Johnson   Account Number:  [id number]  Date Initiated:  03/12/2013  Documentation initiated by:  Lanier Clam  Subjective/Objective Assessment:   ADMITTED W/BENIGN PROSTATE HYPERTROPHY.     Action/Plan:   FROM HOME.HAS PCP,PHARMACY.   Anticipated DC Date:  03/12/2013   Anticipated DC Plan:  HOME/SELF CARE      DC Planning Services  CM consult      Choice offered to / List presented to:             Status of service:  Completed, signed off Medicare Important Message given?   (If response is "NO", the following Medicare IM given date fields will be blank) Date Medicare IM given:   Date Additional Medicare IM given:    Discharge Disposition:  HOME/SELF CARE  Per UR Regulation:  Reviewed for med. necessity/level of care/duration of stay  If discussed at Long Length of Stay Meetings, dates discussed:    Comments:  03/12/13 Krystol Rocco RN,BSN NCM [phone] S/P CYSTOLITHALOPAXY.

## 2013-04-08 ENCOUNTER — Ambulatory Visit (HOSPITAL_COMMUNITY)
Admission: RE | Admit: 2013-04-08 | Discharge: 2013-04-08 | Disposition: A | Discharge status: Home / Self Care | Payer: Medicare Other | Source: Ambulatory Visit | Attending: Internal Medicine | Admitting: Internal Medicine

## 2013-04-08 ENCOUNTER — Other Ambulatory Visit (HOSPITAL_COMMUNITY): Payer: Self-pay | Admitting: Internal Medicine

## 2013-04-08 DIAGNOSIS — F17200 Nicotine dependence, unspecified, uncomplicated: Secondary | ICD-10-CM | POA: Insufficient documentation

## 2013-04-08 DIAGNOSIS — I10 Essential (primary) hypertension: Secondary | ICD-10-CM

## 2013-04-13 IMAGING — CR DG CHEST 2V
2 series · 2 of 2 positions shown · non-contrast
Comparison: 03/23/2010

CLINICAL DATA: Hypertension.  Smoker.

CHEST - 2 VIEW

[view not recorded (1 of 2)]
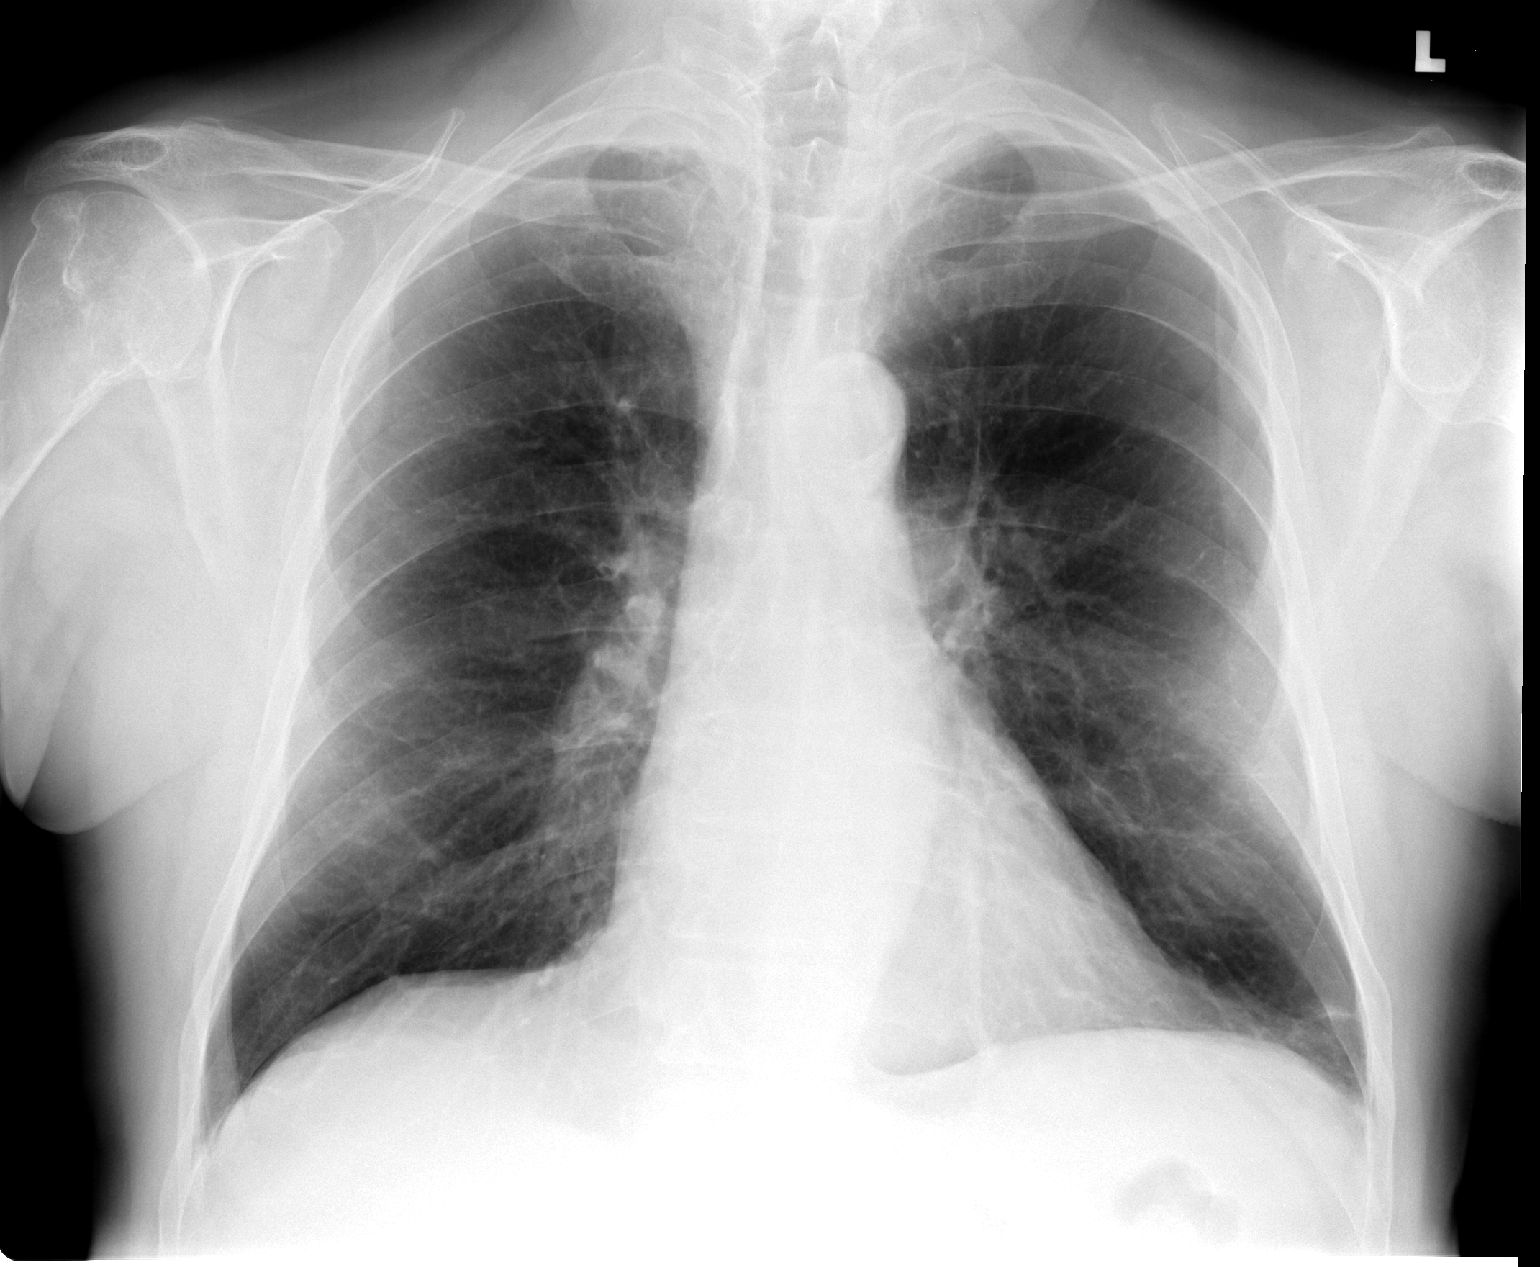

[view not recorded (2 of 2)]
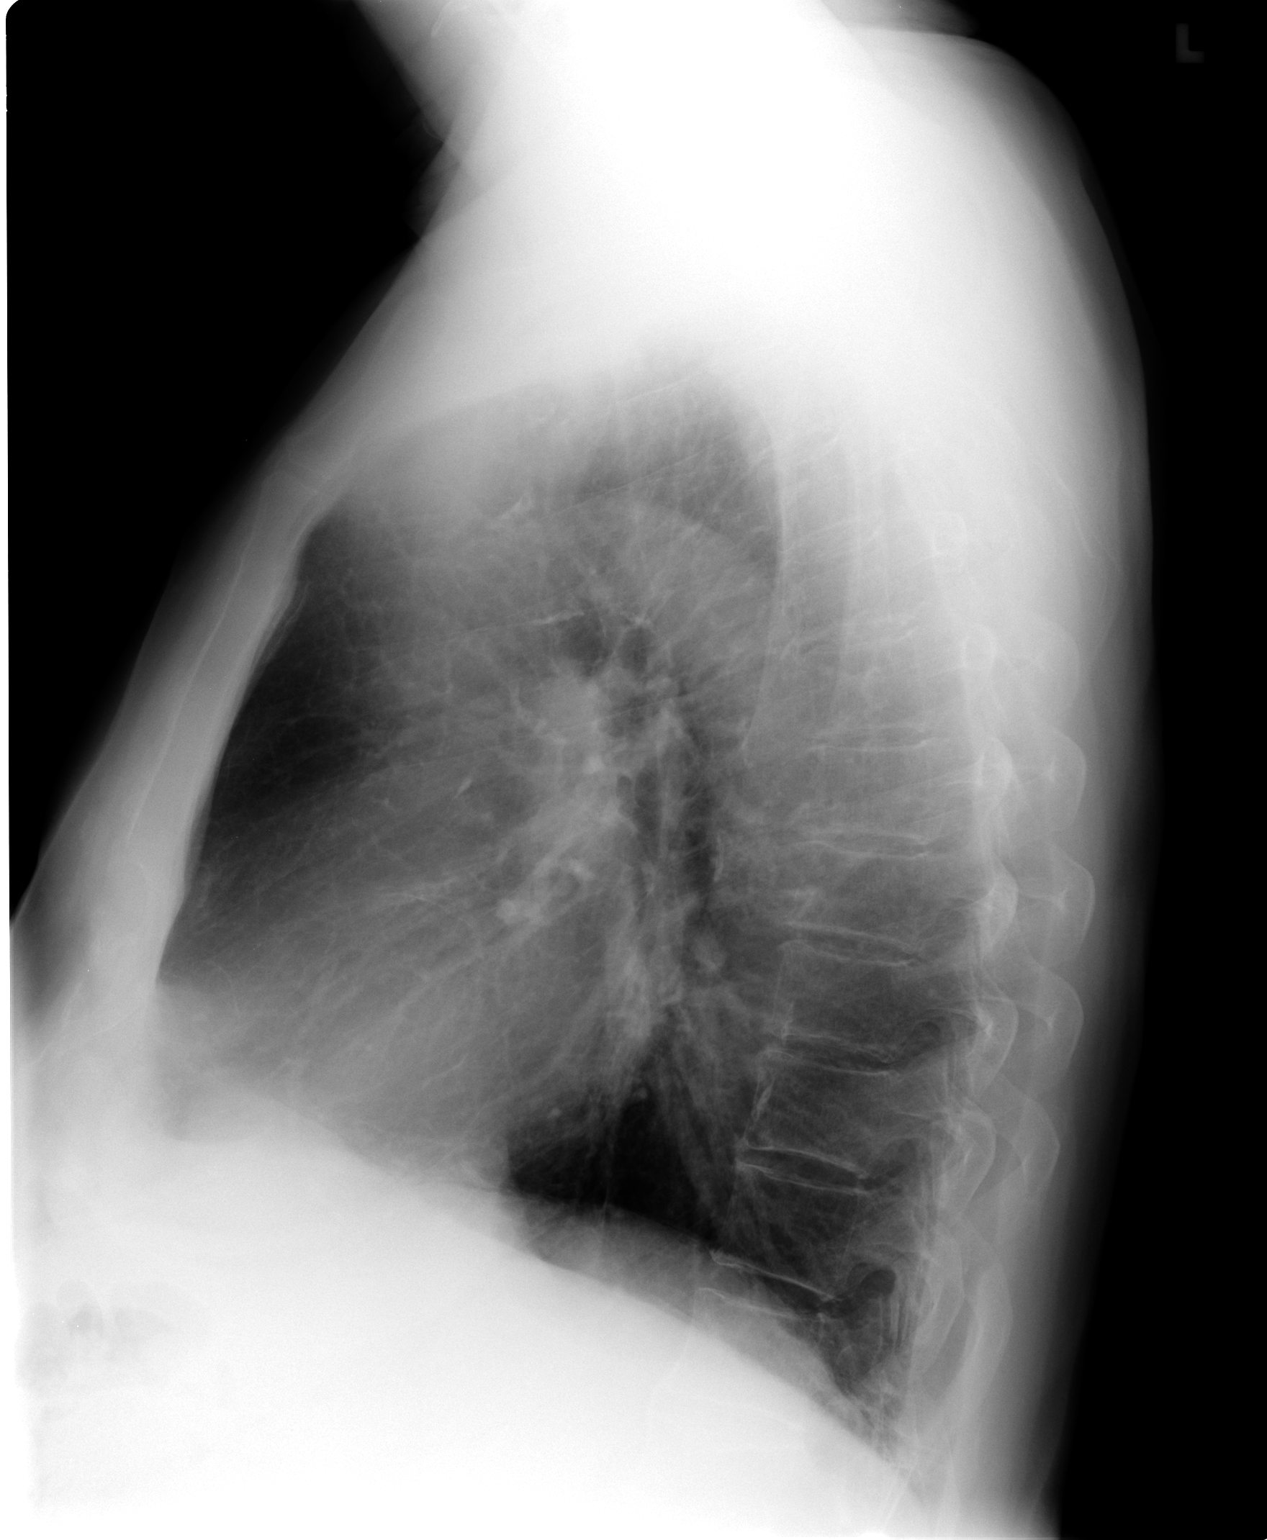

[2 of 2 positions shown; findings below may reference images not displayed]

FINDINGS: Mild pulmonary hyperinflation appears stable as well as
mild scarring at the left lung base.  No evidence of pulmonary
infiltrate or edema.  No evidence of pleural effusion.  Heart size
is within normal limits.  No mass or lymphadenopathy identified.
IMPRESSION: Stable exam.  No active disease.

## 2014-01-04 ENCOUNTER — Other Ambulatory Visit: Payer: Self-pay | Admitting: Internal Medicine

## 2014-01-04 ENCOUNTER — Other Ambulatory Visit: Payer: Self-pay | Admitting: Emergency Medicine

## 2014-01-04 MED ORDER — FENOFIBRATE MICRONIZED 134 MG PO CAPS: 134.0000 mg | ORAL_CAPSULE | Freq: Every day | ORAL | Status: DC

## 2014-01-09 ENCOUNTER — Encounter: Payer: Self-pay | Admitting: *Deleted

## 2014-01-10 ENCOUNTER — Other Ambulatory Visit: Payer: Self-pay

## 2014-01-10 MED ORDER — FENOFIBRATE MICRONIZED 134 MG PO CAPS: 134.0000 mg | ORAL_CAPSULE | Freq: Every day | ORAL | Status: DC

## 2014-01-14 ENCOUNTER — Ambulatory Visit (INDEPENDENT_AMBULATORY_CARE_PROVIDER_SITE_OTHER): Payer: Medicare Other | Admitting: Emergency Medicine

## 2014-01-14 ENCOUNTER — Encounter: Payer: Self-pay | Admitting: Emergency Medicine

## 2014-01-14 VITALS — BP 122/70 | HR 64 | Temp 98.2°F | Resp 16 | Ht 67.5 in | Wt 170.0 lb

## 2014-01-14 DIAGNOSIS — I10 Essential (primary) hypertension: Secondary | ICD-10-CM | POA: Insufficient documentation

## 2014-01-14 DIAGNOSIS — R7309 Other abnormal glucose: Secondary | ICD-10-CM

## 2014-01-14 DIAGNOSIS — E559 Vitamin D deficiency, unspecified: Secondary | ICD-10-CM

## 2014-01-14 DIAGNOSIS — E785 Hyperlipidemia, unspecified: Secondary | ICD-10-CM

## 2014-01-14 DIAGNOSIS — E782 Mixed hyperlipidemia: Secondary | ICD-10-CM

## 2014-01-14 DIAGNOSIS — C4490 Unspecified malignant neoplasm of skin, unspecified: Secondary | ICD-10-CM | POA: Insufficient documentation

## 2014-01-14 DIAGNOSIS — Z87442 Personal history of urinary calculi: Secondary | ICD-10-CM | POA: Insufficient documentation

## 2014-01-14 LAB — CBC WITH DIFFERENTIAL/PLATELET
BASOS PCT: 0 % (ref 0–1)
Basophils Absolute: 0 10*3/uL (ref 0.0–0.1)
EOS ABS: 0.2 10*3/uL (ref 0.0–0.7)
EOS PCT: 4 % (ref 0–5)
HEMATOCRIT: 46 % (ref 39.0–52.0)
HEMOGLOBIN: 15.7 g/dL (ref 13.0–17.0)
Lymphocytes Relative: 26 % (ref 12–46)
Lymphs Abs: 1.6 10*3/uL (ref 0.7–4.0)
MCH: 33.8 pg (ref 26.0–34.0)
MCHC: 34.1 g/dL (ref 30.0–36.0)
MCV: 99.1 fL (ref 78.0–100.0)
MONO ABS: 0.6 10*3/uL (ref 0.1–1.0)
MONOS PCT: 9 % (ref 3–12)
NEUTROS PCT: 61 % (ref 43–77)
Neutro Abs: 3.6 10*3/uL (ref 1.7–7.7)
Platelets: 156 10*3/uL (ref 150–400)
RBC: 4.64 MIL/uL (ref 4.22–5.81)
RDW: 13.6 % (ref 11.5–15.5)
WBC: 6 10*3/uL (ref 4.0–10.5)

## 2014-01-14 LAB — LIPID PANEL
CHOL/HDL RATIO: 1.9 ratio
Cholesterol: 138 mg/dL (ref 0–200)
HDL: 73 mg/dL (ref >39)
LDL Cholesterol: 50 mg/dL (ref 0–99)
Triglycerides: 75 mg/dL (ref <150)
VLDL: 15 mg/dL (ref 0–40)

## 2014-01-14 LAB — HEMOGLOBIN A1C
HEMOGLOBIN A1C: 5.5 % (ref <5.7)
Mean Plasma Glucose: 111 mg/dL (ref <117)

## 2014-01-14 LAB — HEPATIC FUNCTION PANEL
ALBUMIN: 4 g/dL (ref 3.5–5.2)
ALK PHOS: 40 U/L (ref 39–117)
ALT: 15 U/L (ref 0–53)
AST: 27 U/L (ref 0–37)
BILIRUBIN INDIRECT: 0.6 mg/dL (ref 0.0–0.9)
BILIRUBIN TOTAL: 0.8 mg/dL (ref 0.3–1.2)
Bilirubin, Direct: 0.2 mg/dL (ref 0.0–0.3)
Total Protein: 6.4 g/dL (ref 6.0–8.3)

## 2014-01-14 LAB — BASIC METABOLIC PANEL WITH GFR
BUN: 15 mg/dL (ref 6–23)
CALCIUM: 9.5 mg/dL (ref 8.4–10.5)
CO2: 28 mEq/L (ref 19–32)
Chloride: 106 mEq/L (ref 96–112)
Creat: 1.11 mg/dL (ref 0.50–1.35)
GFR, EST AFRICAN AMERICAN: 74 mL/min
GFR, Est Non African American: 64 mL/min
GLUCOSE: 91 mg/dL (ref 70–99)
Potassium: 4.1 mEq/L (ref 3.5–5.3)
SODIUM: 144 meq/L (ref 135–145)

## 2014-01-14 NOTE — Patient Instructions (Signed)
Squamous Cell Carcinoma  Squamous cell carcinoma is the second most common form of skin cancer. It begins in the squamous cells in the outer layer of the skin (epidermis).  CAUSES  Ultraviolet light exposure is the most common cause of squamous cell carcinoma. This may come from sunlight or tanning beds. Squamous cell carcinoma is most common in sun-exposed areas like the face, neck, arms, and hands. However, squamous cell carcinoma can occur anywhere on the body, including the lips, inside the mouth, the legs, sites of long-term (chronic) scarring, and the anus.  Other causes of squamous cell carcinoma can include:  Exposure to arsenic.  Exposure to radiation.  Exposure to toxic tars and oils. RISK FACTORS Factors that increase your risk for squamous cell carcinoma include:  Having fair skin.  Being middle-aged or elderly.  Heavy sun exposure, especially during childhood.  Repeated sunburns.  Use of tanning beds.  A weakened immune system. This includes patients who have received a transplant and patients with human immunodeficiency virus (HIV) or acquired immunodeficency syndrome (AIDS).  Human papillomavirus infection.  Conditions that cause chronic scarring. This can include burn scars, chronic ulcers, heat (thermal) injuries, and radiation.  Exposure to psoralen plus ultraviolet A light therapy.  Exposure to chemical carcinogens, such as tar, soot, and arsenic.  Chronic, inflammatory conditions such as lupus, lichen planus, or lichen sclerosus.  Chronic infections, such as infections of the bone (osteomyelitis).  Smoking. SYMPTOMS  Squamous cell carcinoma often starts as small, skin-colored (pink or brown) sandpaper-like growths. These growths are called solar keratoses or actinic keratoses. These growths are often more easily felt than seen.  DIAGNOSIS  Your caregiver may be able to tell what is wrong by doing a physical exam. Often, a tissue sample is also taken. The  tissue sample is examined under a microscope.  TREATMENT  The treatment for squamous cell carcinoma depends on the size and location of the tumors, as well as your overall health. Possible treatments include:   Mohs surgery. This is a procedure done by a skin doctor (dermatologist or Mohs surgeon) in his or her office. The cancerous cells are removed layer by layer.  Laser surgery to remove the tumor.  Freezing the tumor with liquid nitrogen (cryosurgery).  Radiation. This may be used for tumors on the face.  Electrodesiccation and curettage. This involves alternately scraping and burning the tumor, using an electric current to control bleeding. If treated soon enough, squamous cell carcinoma rarely spreads to other areas of the body (metastasizes). If left untreated, however, squamous cell carcinoma will destroy the nearby tissues. This can result in the loss of a nose or ear. PREVENTION  Avoid the sun between 10:00 am and 4:00 pm when it is the strongest.  Use a sunscreen or sunblock with sun protection factor 30 or greater.  Apply sunscreen at least 30 minutes before exposure to the sun.  Reapply sunscreen every 2 to 4 hours while you are outside, after swimming, and after excessive sweating.  Always wear protective hats, clothing, and sunglasses with ultraviolet protection.  Avoid tanning beds. HOME CARE INSTRUCTIONS   Avoid unprotected sun exposure.  Do not smoke.  Follow your caregiver's instructions for self-exams. Look for new growths or changes in your skin.  Keep all follow-up appointments as directed by your caregiver. SEEK MEDICAL CARE IF:   You notice any new growths or changes in your skin.  You have had a squamous cell carcinoma tumor removed and you notice a new growth in   the same location. Document Released: 06/22/2003 Document Revised: 03/09/2012 Document Reviewed: 09/09/2011 [employer] Patient Information 2014 [employer].  

## 2014-01-14 NOTE — Progress Notes (Signed)
Subjective:    Patient ID: Bobby Johnson, male    DOB: [birth year], 79 y.o.   MRN: [id number]  HPI Comments: 79 yo male presents for 3 month F/U for HTN, Cholesterol, Pre-Dm, D. deficient LAST LABS T 122 H 70 L 39 A1C 5.8 MAG 1.8 D 46 INSULIN 11 He is keeping busy but denies cardio. He eats descent except for holidays. He notes BP has been good at home. He is doing well overall.   He had skin exam at Derm 6 months ago and WNL. He has noticed change in spot on left cheek over last 2 weeks.  Hypertension  Hyperlipidemia   Current Outpatient Prescriptions on File Prior to Visit  Medication Sig Dispense Refill  . ALPRAZolam (XANAX) 0.5 MG tablet Take 0.5 mg by mouth daily as needed for anxiety.      Marland Kitchen aspirin EC 81 MG tablet Take 81 mg by mouth daily.      . bisoprolol-hydrochlorothiazide (ZIAC) 5-6.25 MG per tablet TAKE ONE TABLET BY MOUTH EVERY DAY  90 tablet  1  . cholecalciferol (VITAMIN D) 1000 UNITS tablet Take 1,000 Units by mouth daily.      . fenofibrate micronized (LOFIBRA) 134 MG capsule Take 1 capsule (134 mg total) by mouth daily before breakfast.  90 capsule  3  . rosuvastatin (CRESTOR) 40 MG tablet Take 40 mg by mouth daily. Takes 1/2 = 20 mg       No current facility-administered medications on file prior to visit.   ALLERGIES Doxazosin; Fish oil; Flax seeds; and Lipitor  Past Medical History  Diagnosis Date  . Hypertension   . Hyperlipidemia   . Myocardial infarction 1986  . History of skin cancer 1995  . Bladder stone   . Enlarged prostate   . Frequency of urination   . History of kidney stones   . Cancer     SKIN   . Vitamin D deficiency       Review of Systems  Skin: Positive for color change.  All other systems reviewed and are negative.   BP 122/70  Pulse 64  Temp(Src) 98.2 F (36.8 C) (Temporal)  Resp 16  Ht 5' 7.5" (1.715 m)  Wt 170 lb (77.111 kg)  BMI 26.22 kg/m2     Objective:   Physical Exam  Nursing note and vitals  reviewed. Constitutional: He is oriented to person, place, and time. He appears well-developed and well-nourished.  HENT:  Head: Normocephalic and atraumatic.  Right Ear: External ear normal.  Left Ear: External ear normal.  Nose: Nose normal.  Eyes: Conjunctivae and EOM are normal.  Neck: Normal range of motion. Neck supple. No JVD present. No thyromegaly present.  Cardiovascular: Normal rate, regular rhythm, normal heart sounds and intact distal pulses.   Pulmonary/Chest: Effort normal and breath sounds normal.  Abdominal: Soft. Bowel sounds are normal. He exhibits no distension and no mass. There is no tenderness. There is no rebound and no guarding.  Musculoskeletal: Normal range of motion. He exhibits no edema and no tenderness.  Lymphadenopathy:    He has no cervical adenopathy.  Neurological: He is alert and oriented to person, place, and time. He has normal reflexes. No cranial nerve deficit. Coordination normal.  Skin: Skin is warm and dry.     Psychiatric: He has a normal mood and affect. His behavior is normal. Judgment and thought content normal.          Assessment & Plan:  1.  3 month  F/U for HTN, Cholesterol, Pre-Dm, D. Deficient. Needs healthy diet, cardio QD and obtain healthy weight. Check Labs, Check BP if >130/80 call office 2. ? SQ Cell Carcinoma/ left cheek- If no change after 1 week pt w/c dermatologist

## 2014-01-15 LAB — INSULIN, FASTING: Insulin fasting, serum: 20 u[IU]/mL (ref 3–28)

## 2014-04-06 ENCOUNTER — Other Ambulatory Visit: Payer: Self-pay | Admitting: Emergency Medicine

## 2014-04-06 MED ORDER — ALPRAZOLAM 0.5 MG PO TABS
0.5000 mg | ORAL_TABLET | Freq: Every day | ORAL | Status: DC | PRN
Start: 2014-04-06 — End: 2014-07-18

## 2014-04-15 ENCOUNTER — Encounter: Payer: Self-pay | Admitting: Internal Medicine

## 2014-04-15 ENCOUNTER — Ambulatory Visit (INDEPENDENT_AMBULATORY_CARE_PROVIDER_SITE_OTHER): Payer: Medicare Other | Admitting: Internal Medicine

## 2014-04-15 VITALS — BP 138/84 | HR 72 | Temp 97.9°F | Resp 16 | Ht 67.5 in | Wt 168.6 lb

## 2014-04-15 DIAGNOSIS — R7309 Other abnormal glucose: Secondary | ICD-10-CM

## 2014-04-15 DIAGNOSIS — Z789 Other specified health status: Secondary | ICD-10-CM

## 2014-04-15 DIAGNOSIS — R7303 Prediabetes: Secondary | ICD-10-CM | POA: Insufficient documentation

## 2014-04-15 DIAGNOSIS — Z1331 Encounter for screening for depression: Secondary | ICD-10-CM

## 2014-04-15 DIAGNOSIS — Z125 Encounter for screening for malignant neoplasm of prostate: Secondary | ICD-10-CM

## 2014-04-15 DIAGNOSIS — Z1212 Encounter for screening for malignant neoplasm of rectum: Secondary | ICD-10-CM

## 2014-04-15 DIAGNOSIS — Z79899 Other long term (current) drug therapy: Secondary | ICD-10-CM

## 2014-04-15 DIAGNOSIS — E559 Vitamin D deficiency, unspecified: Secondary | ICD-10-CM | POA: Insufficient documentation

## 2014-04-15 DIAGNOSIS — E785 Hyperlipidemia, unspecified: Secondary | ICD-10-CM

## 2014-04-15 DIAGNOSIS — I10 Essential (primary) hypertension: Secondary | ICD-10-CM

## 2014-04-15 LAB — CBC WITH DIFFERENTIAL/PLATELET
Basophils Absolute: 0 10*3/uL (ref 0.0–0.1)
Basophils Relative: 0 % (ref 0–1)
EOS PCT: 2 % (ref 0–5)
Eosinophils Absolute: 0.1 10*3/uL (ref 0.0–0.7)
HEMATOCRIT: 47.4 % (ref 39.0–52.0)
HEMOGLOBIN: 16.1 g/dL (ref 13.0–17.0)
LYMPHS ABS: 1.8 10*3/uL (ref 0.7–4.0)
LYMPHS PCT: 31 % (ref 12–46)
MCH: 33.5 pg (ref 26.0–34.0)
MCHC: 34 g/dL (ref 30.0–36.0)
MCV: 98.5 fL (ref 78.0–100.0)
MONO ABS: 0.6 10*3/uL (ref 0.1–1.0)
Monocytes Relative: 11 % (ref 3–12)
Neutro Abs: 3.2 10*3/uL (ref 1.7–7.7)
Neutrophils Relative %: 56 % (ref 43–77)
Platelets: 153 10*3/uL (ref 150–400)
RBC: 4.81 MIL/uL (ref 4.22–5.81)
RDW: 13.6 % (ref 11.5–15.5)
WBC: 5.7 10*3/uL (ref 4.0–10.5)

## 2014-04-15 LAB — HEMOGLOBIN A1C
Hgb A1c MFr Bld: 5.6 % (ref <5.7)
Mean Plasma Glucose: 114 mg/dL (ref <117)

## 2014-04-15 IMAGING — CR DG CHEST 2V
2 series · 2 of 2 positions shown · non-contrast
Comparison: April 06, 2012.

CLINICAL DATA: Hypertension.

CHEST - 2 VIEW

[view not recorded (1 of 2)]
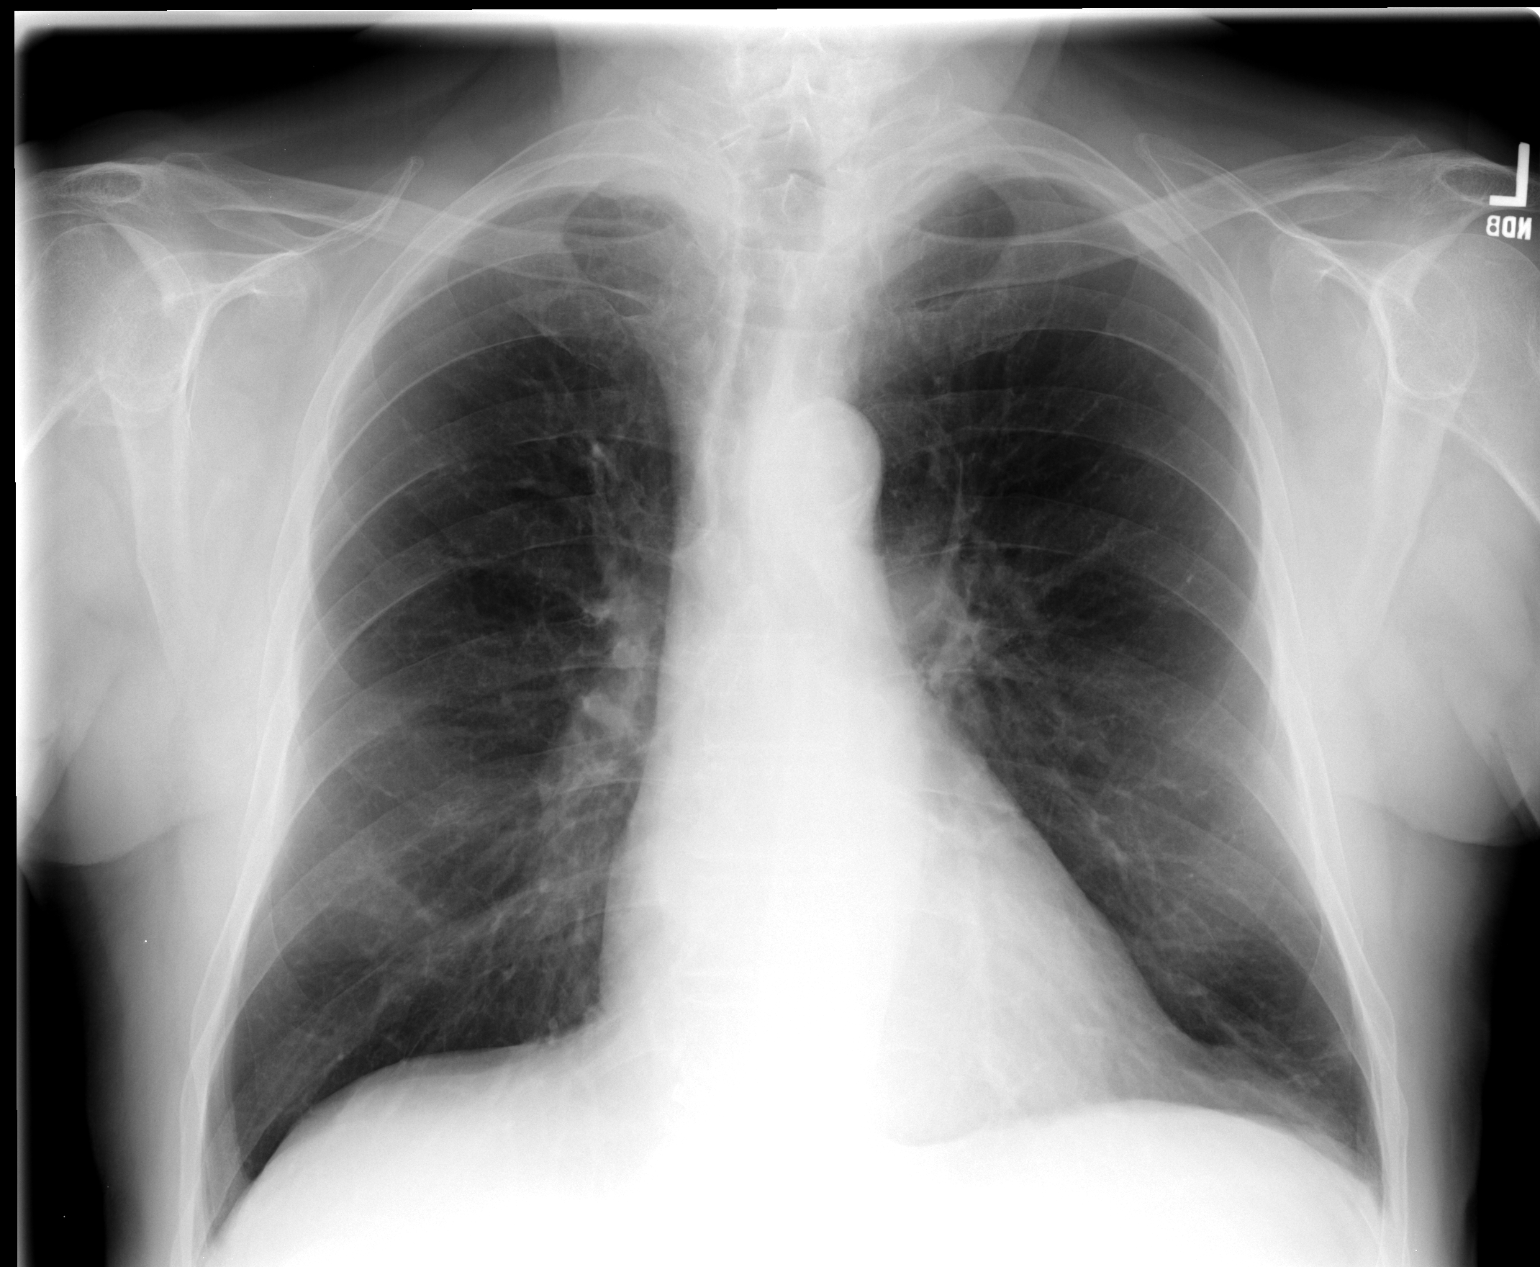

[view not recorded (2 of 2)]
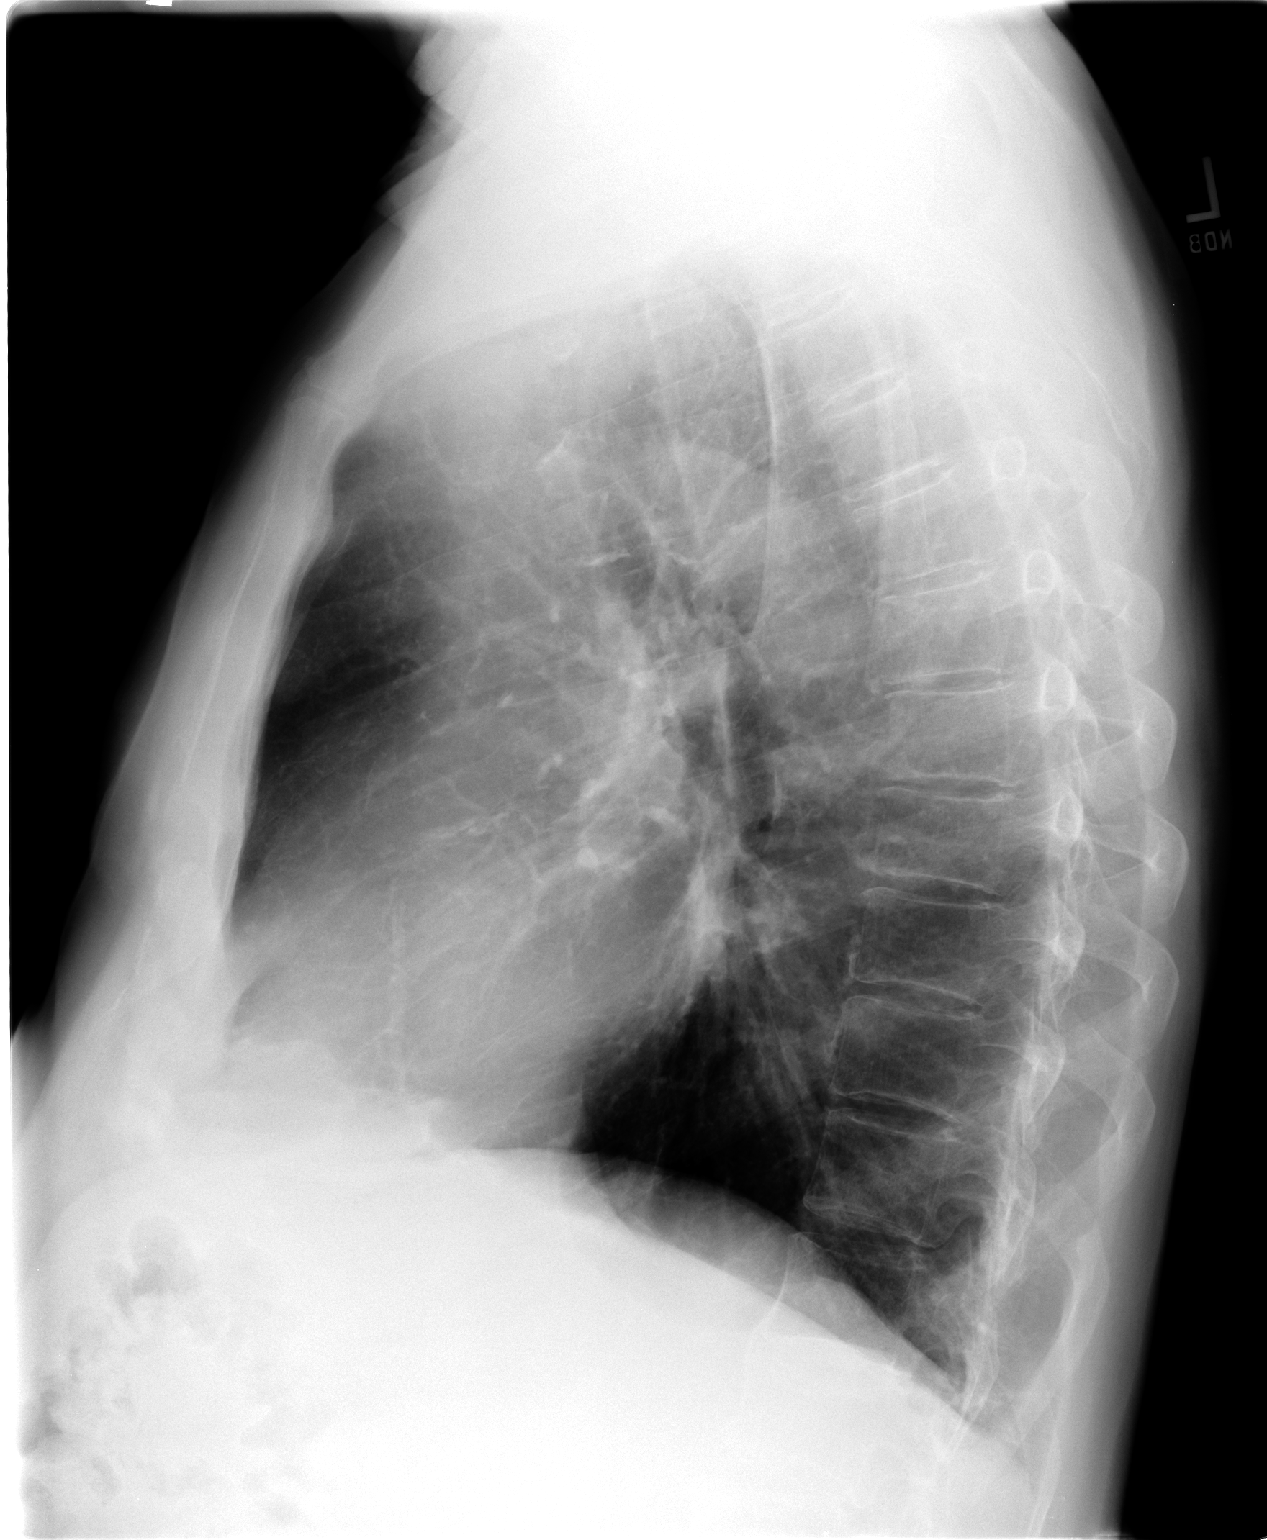

[2 of 2 positions shown; findings below may reference images not displayed]

FINDINGS: Cardiomediastinal silhouette appears normal.  No acute
pulmonary disease is noted.  Bony thorax is intact.
IMPRESSION: No acute cardiopulmonary abnormality seen.

## 2014-04-15 NOTE — Patient Instructions (Signed)

## 2014-04-15 NOTE — Progress Notes (Signed)
Patient ID: Bobby Johnson, male   DOB: 1935-10-30, 79 y.o.   MRN: [id number]   Annual Screening Comprehensive Examination  This very nice 79 y.o.  MWM presents for complete physical.  Patient has been followed for HTN, Prediabetes, Hyperlipidemia, and Vitamin D Deficiency.   HTN predates since 44. Patient's BP has been controlled at home.Today's BP: 138/84 mmHg. In 1986 patient presented with ACS and had PTCA of the LAD. Patient denies any cardiac symptoms as chest pain, palpitations, shortness of breath, dizziness or ankle swelling.   Patient's hyperlipidemia is controlled with diet and medications. Patient denies myalgias or other medication SE's. Last cholesterol last visit was 138, triglycerides 75, HDL 73 and LDL 50 in Jan 2015 - all at goal.     Patient has prediabetes with A1c 5.7% in June 2011 and  last A1c was 5.5% in Jan 2015. Patient denies reactive hypoglycemic symptoms, visual blurring, diabetic polys, or paresthesias.    Finally, patient has history of Vitamin D Deficiency of 28 in 2008  and last vitamin D was 46 in Oct 2014.  Medication Sig  . ALPRAZolam (XANAX) 0.5 MG tablet Take 1 tablet (0.5 mg total) by mouth daily as needed for anxiety.  Marland Kitchen aspirin EC 81 MG tablet Take 81 mg by mouth daily.  . bisoprolol-hydrochlorothiazide (ZIAC) 5-6.25 MG  TAKE ONE TABLET BY MOUTH EVERY DAY  . fenofibrate micronized (LOFIBRA) 134 MG capsule Take 1 capsule (134 mg total) by mouth daily before breakfast.  . rosuvastatin (CRESTOR) 40 MG tablet Take 40 mg by mouth daily. Takes 1/2 = 20 mg   Allergies  Allergen Reactions  . Doxazosin     syncope  . Fish Oil     GI upset  . Flax Seeds [Flaxseed (Linseed)]     GI upset  . Lipitor [Atorvastatin]     myalgias   Past Medical History  Diagnosis Date  . Hypertension   . Hyperlipidemia   . Myocardial infarction 1986  . History of skin cancer 1995  . Bladder stone   . Enlarged prostate   . Frequency of urination   . History of kidney  stones   . Cancer     SKIN   . Vitamin D deficiency    Past Surgical History  Procedure Laterality Date  . Lithotripsy    . Cataracts      REMOVED  . Skin cancer excision    . Cystoscopy with litholapaxy N/A 03/11/2013    Procedure: CYSTOSCOPY WITH LITHOLAPAXY;  Surgeon: Malka So, MD;  Location: [facility];  Service: Urology;  Laterality: N/A;  90 mins requested for this case    . Holmium laser application N/A 05/17/8415    Procedure: HOLMIUM LASER APPLICATION;  Surgeon: Malka So, MD;  Location: [facility];  Service: Urology;  Laterality: N/A;  . Transurethral resection of prostate N/A 03/11/2013    Procedure: TRANSURETHRAL RESECTION OF THE PROSTATE WITH GYRUS INSTRUMENTS;  Surgeon: Malka So, MD;  Location: [facility];  Service: Urology;  Laterality: N/A;    Family History  Problem Relation Age of Onset  . Heart disease Mother   . Diabetes Mother   . Hypertension Father   . Heart disease Father   . Heart attack Sister   . Heart disease Sister   . Diabetes Sister   . Heart disease Brother   . Heart disease Sister    History   Social History  . Marital Status: Married    Spouse Name: N/A  Number of Children: N/A  . Years of Education: N/A   Occupational History  . 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 Smoking status: 1/2 ppd  . Smokeless tobacco: Not on file  . Alcohol Use: Only admits 8 beers/week    8 drink(s) per week     Comment: OCCASIONAL  . Drug Use: No  . Sexual Activity: no    ROS Constitutional: Denies fever, chills, weight loss/gain, headaches, insomnia, fatigue, night sweats, and change in appetite. Eyes: Denies redness, blurred vision, diplopia, discharge, itchy, watery eyes.  ENT: Denies discharge, congestion, post nasal drip, epistaxis, sore throat, earache, hearing loss, dental pain, Tinnitus, Vertigo, Sinus pain, snoring.  Cardio: Denies chest pain, palpitations, irregular heartbeat, syncope, dyspnea, diaphoresis,  orthopnea, PND, claudication, edema Respiratory: denies cough, dyspnea, DOE, pleurisy, hoarseness, laryngitis, wheezing.  Gastrointestinal: Denies dysphagia, heartburn, reflux, water brash, pain, cramps, nausea, vomiting, bloating, diarrhea, constipation, hematemesis, melena, hematochezia, jaundice, hemorrhoids Genitourinary: Denies dysuria, frequency, urgency, nocturia, hesitancy, discharge, hematuria, flank pain Musculoskeletal: Denies arthralgia, myalgia, stiffness, Jt. Swelling, pain, limp, and strain/sprain. Skin: Denies puritis, rash, hives, warts, acne, eczema, changing in skin lesion Neuro: No weakness, tremor, incoordination, spasms, paresthesia, pain Psychiatric: Denies confusion, memory loss, sensory loss Endocrine: Denies change in weight, skin, hair change, nocturia, and paresthesia, diabetic polys, visual blurring, hyper / hypo glycemic episodes.  Heme/Lymph: No excessive bleeding, bruising, or elarged lymph nodes.  Physical Exam  BP 138/84  Pulse 72  Temp 97.9 F   Resp 16  Ht 5' 7.5"   Wt 168 lb 9.6 oz   BMI 26.00 kg/m2  General Appearance: Well nourished, in no apparent distress. Eyes: PERRLA, EOMs, conjunctiva no swelling or erythema, normal fundi and vessels. Sinuses: No frontal/maxillary tenderness ENT/Mouth: EACs patent / TMs  nl. Nares clear without erythema, swelling, mucoid exudates. Oral hygiene is good. No erythema, swelling, or exudate. Tongue normal, non-obstructing. Tonsils not swollen or erythematous. Hearing normal.  Neck: Supple, thyroid normal. No bruits, nodes or JVD. Respiratory: Respiratory effort normal.  BS equal and clear bilateral without rales, rhonci, wheezing or stridor. Cardio: Heart sounds are normal with regular rate and rhythm and no murmurs, rubs or gallops. Peripheral pulses are normal and equal bilaterally without edema. No aortic or femoral bruits. Chest: symmetric with normal excursions and percussion.  Abdomen: Flat, soft, with bowl  sounds. Nontender, no guarding, rebound, hernias, masses, or organomegaly.  Lymphatics: Non tender without lymphadenopathy.  Genitourinary: No hernias.Testes nl. DRE - prostate nl for age - smooth & firm w/o nodules. Musculoskeletal: Full ROM all peripheral extremities, joint stability, 5/5 strength, and normal gait. Skin: Warm and dry without rashes, lesions, cyanosis, clubbing or  ecchymosis.  Neuro: Cranial nerves intact, reflexes equal bilaterally. Normal muscle tone, no cerebellar symptoms. Sensation intact.  Pysch: Awake and oriented X 3, normal affect, insight and judgment appropriate.   Assessment and Plan   1. Hypertension  2. Hyperlipidemia 2. Pre Diabetes 4. Vitamin D Deficiency 5. ASCAD/PTCA   Continue prudent diet as discussed, weight control, BP monitoring, regular exercise, and medications as discussed.  Discussed med effects and SE's. Routine screening labs and tests as requested with regular follow-up as recommended. ` Patient was counseled and advisedto avoid excess alcohol intake and also encouraged complete tobacco cessation.

## 2014-04-16 LAB — BASIC METABOLIC PANEL WITH GFR
BUN: 14 mg/dL (ref 6–23)
CHLORIDE: 105 meq/L (ref 96–112)
CO2: 29 meq/L (ref 19–32)
CREATININE: 1.21 mg/dL (ref 0.50–1.35)
Calcium: 9.3 mg/dL (ref 8.4–10.5)
GFR, Est African American: 67 mL/min
GFR, Est Non African American: 58 mL/min — ABNORMAL LOW
Glucose, Bld: 100 mg/dL — ABNORMAL HIGH (ref 70–99)
POTASSIUM: 4.4 meq/L (ref 3.5–5.3)
Sodium: 143 mEq/L (ref 135–145)

## 2014-04-16 LAB — LIPID PANEL
Cholesterol: 137 mg/dL (ref 0–200)
HDL: 59 mg/dL (ref >39)
LDL CALC: 62 mg/dL (ref 0–99)
Total CHOL/HDL Ratio: 2.3 Ratio
Triglycerides: 82 mg/dL (ref <150)
VLDL: 16 mg/dL (ref 0–40)

## 2014-04-16 LAB — PSA: PSA: 1.5 ng/mL (ref <=4.00)

## 2014-04-16 LAB — HEPATIC FUNCTION PANEL
ALBUMIN: 4 g/dL (ref 3.5–5.2)
ALK PHOS: 41 U/L (ref 39–117)
ALT: 20 U/L (ref 0–53)
AST: 35 U/L (ref 0–37)
BILIRUBIN INDIRECT: 0.5 mg/dL (ref 0.2–1.2)
BILIRUBIN TOTAL: 0.7 mg/dL (ref 0.2–1.2)
Bilirubin, Direct: 0.2 mg/dL (ref 0.0–0.3)
Total Protein: 6.2 g/dL (ref 6.0–8.3)

## 2014-04-16 LAB — MICROALBUMIN / CREATININE URINE RATIO
Creatinine, Urine: 150.5 mg/dL
Microalb Creat Ratio: 19.7 mg/g (ref 0.0–30.0)
Microalb, Ur: 2.97 mg/dL — ABNORMAL HIGH (ref 0.00–1.89)

## 2014-04-16 LAB — TSH: TSH: 1.702 u[IU]/mL (ref 0.350–4.500)

## 2014-04-16 LAB — INSULIN, FASTING: INSULIN FASTING, SERUM: 15 u[IU]/mL (ref 3–28)

## 2014-04-16 LAB — MAGNESIUM: Magnesium: 1.7 mg/dL (ref 1.5–2.5)

## 2014-04-16 LAB — VITAMIN D 25 HYDROXY (VIT D DEFICIENCY, FRACTURES): Vit D, 25-Hydroxy: 35 ng/mL (ref 30–89)

## 2014-07-05 ENCOUNTER — Other Ambulatory Visit: Payer: Self-pay | Admitting: Internal Medicine

## 2014-07-15 ENCOUNTER — Ambulatory Visit: Payer: Self-pay | Admitting: Physician Assistant

## 2014-07-18 ENCOUNTER — Other Ambulatory Visit: Payer: Self-pay | Admitting: *Deleted

## 2014-07-18 MED ORDER — ALPRAZOLAM 0.5 MG PO TABS: 0.5000 mg | ORAL_TABLET | Freq: Every day | ORAL | Status: DC | PRN

## 2014-08-02 ENCOUNTER — Ambulatory Visit (INDEPENDENT_AMBULATORY_CARE_PROVIDER_SITE_OTHER): Payer: Medicare Other | Admitting: Physician Assistant

## 2014-08-02 ENCOUNTER — Encounter: Payer: Self-pay | Admitting: Physician Assistant

## 2014-08-02 VITALS — BP 138/72 | HR 64 | Temp 98.6°F | Resp 16 | Ht 67.5 in | Wt 169.0 lb

## 2014-08-02 DIAGNOSIS — E559 Vitamin D deficiency, unspecified: Secondary | ICD-10-CM

## 2014-08-02 DIAGNOSIS — Z0000 Encounter for general adult medical examination without abnormal findings: Secondary | ICD-10-CM

## 2014-08-02 DIAGNOSIS — Z1331 Encounter for screening for depression: Secondary | ICD-10-CM

## 2014-08-02 DIAGNOSIS — R7309 Other abnormal glucose: Secondary | ICD-10-CM

## 2014-08-02 DIAGNOSIS — E785 Hyperlipidemia, unspecified: Secondary | ICD-10-CM

## 2014-08-02 DIAGNOSIS — I10 Essential (primary) hypertension: Secondary | ICD-10-CM

## 2014-08-02 DIAGNOSIS — Z789 Other specified health status: Secondary | ICD-10-CM

## 2014-08-02 DIAGNOSIS — Z79899 Other long term (current) drug therapy: Secondary | ICD-10-CM

## 2014-08-02 LAB — HEPATIC FUNCTION PANEL
ALK PHOS: 42 U/L (ref 39–117)
ALT: 17 U/L (ref 0–53)
AST: 30 U/L (ref 0–37)
Albumin: 3.9 g/dL (ref 3.5–5.2)
Bilirubin, Direct: 0.3 mg/dL (ref 0.0–0.3)
Indirect Bilirubin: 0.8 mg/dL (ref 0.2–1.2)
TOTAL PROTEIN: 6.2 g/dL (ref 6.0–8.3)
Total Bilirubin: 1.1 mg/dL (ref 0.2–1.2)

## 2014-08-02 LAB — HEMOGLOBIN A1C
Hgb A1c MFr Bld: 5.6 % (ref <5.7)
MEAN PLASMA GLUCOSE: 114 mg/dL (ref <117)

## 2014-08-02 LAB — TSH: TSH: 1.997 u[IU]/mL (ref 0.350–4.500)

## 2014-08-02 LAB — BASIC METABOLIC PANEL WITH GFR
BUN: 13 mg/dL (ref 6–23)
CO2: 30 mEq/L (ref 19–32)
Calcium: 9.2 mg/dL (ref 8.4–10.5)
Chloride: 105 mEq/L (ref 96–112)
Creat: 1.11 mg/dL (ref 0.50–1.35)
GFR, EST AFRICAN AMERICAN: 74 mL/min
GFR, Est Non African American: 64 mL/min
GLUCOSE: 84 mg/dL (ref 70–99)
Potassium: 4.1 mEq/L (ref 3.5–5.3)
Sodium: 142 mEq/L (ref 135–145)

## 2014-08-02 LAB — CBC WITH DIFFERENTIAL/PLATELET
BASOS PCT: 0 % (ref 0–1)
Basophils Absolute: 0 10*3/uL (ref 0.0–0.1)
EOS ABS: 0.1 10*3/uL (ref 0.0–0.7)
EOS PCT: 2 % (ref 0–5)
HEMATOCRIT: 45.9 % (ref 39.0–52.0)
HEMOGLOBIN: 15.6 g/dL (ref 13.0–17.0)
LYMPHS ABS: 1.7 10*3/uL (ref 0.7–4.0)
Lymphocytes Relative: 32 % (ref 12–46)
MCH: 33.3 pg (ref 26.0–34.0)
MCHC: 34 g/dL (ref 30.0–36.0)
MCV: 97.9 fL (ref 78.0–100.0)
MONO ABS: 0.6 10*3/uL (ref 0.1–1.0)
MONOS PCT: 11 % (ref 3–12)
NEUTROS PCT: 55 % (ref 43–77)
Neutro Abs: 3 10*3/uL (ref 1.7–7.7)
Platelets: 138 10*3/uL — ABNORMAL LOW (ref 150–400)
RBC: 4.69 MIL/uL (ref 4.22–5.81)
RDW: 13.9 % (ref 11.5–15.5)
WBC: 5.4 10*3/uL (ref 4.0–10.5)

## 2014-08-02 LAB — LIPID PANEL
Cholesterol: 111 mg/dL (ref 0–200)
HDL: 52 mg/dL (ref >39)
LDL CALC: 42 mg/dL (ref 0–99)
Total CHOL/HDL Ratio: 2.1 Ratio
Triglycerides: 86 mg/dL (ref <150)
VLDL: 17 mg/dL (ref 0–40)

## 2014-08-02 LAB — MAGNESIUM: MAGNESIUM: 1.8 mg/dL (ref 1.5–2.5)

## 2014-08-02 NOTE — Patient Instructions (Signed)
Preventative Care for Adults, Male       REGULAR HEALTH EXAMS:  A routine yearly physical is a good way to check in with your primary care provider about your health and preventive screening. It is also an opportunity to share updates about your health and any concerns you have, and receive a thorough all-over exam.   Most health insurance companies pay for at least some preventative services.  Check with your health plan for specific coverages.  WHAT PREVENTATIVE SERVICES DO MEN NEED?  Adult men should have their weight and blood pressure checked regularly.   Men age 35 and older should have their cholesterol levels checked regularly.  Beginning at age 50 and continuing to age 75, men should be screened for colorectal cancer.  Certain people should may need continued testing until age 85.  Other cancer screening may include exams for testicular and prostate cancer.  Updating vaccinations is part of preventative care.  Vaccinations help protect against diseases such as the flu.  Lab tests are generally done as part of preventative care to screen for anemia and blood disorders, to screen for problems with the kidneys and liver, to screen for bladder problems, to check blood sugar, and to check your cholesterol level.  Preventative services generally include counseling about diet, exercise, avoiding tobacco, drugs, excessive alcohol consumption, and sexually transmitted infections.    GENERAL RECOMMENDATIONS FOR GOOD HEALTH:  Healthy diet:  Eat a variety of foods, including fruit, vegetables, animal or vegetable protein, such as meat, fish, chicken, and eggs, or beans, lentils, tofu, and grains, such as rice.  Drink plenty of water daily.  Decrease saturated fat in the diet, avoid lots of red meat, processed foods, sweets, fast foods, and fried foods.  Exercise:  Aerobic exercise helps maintain good heart health. At least 30-40 minutes of moderate-intensity exercise is recommended.  For example, a brisk walk that increases your heart rate and breathing. This should be done on most days of the week.   Find a type of exercise or a variety of exercises that you enjoy so that it becomes a part of your daily life.  Examples are running, walking, swimming, water aerobics, and biking.  For motivation and support, explore group exercise such as aerobic class, spin class, Zumba, Yoga,or  martial arts, etc.    Set exercise goals for yourself, such as a certain weight goal, walk or run in a race such as a 5k walk/run.  Speak to your primary care provider about exercise goals.  Disease prevention:  If you smoke or chew tobacco, find out from your caregiver how to quit. It can literally save your life, no matter how long you have been a tobacco user. If you do not use tobacco, never begin.   Maintain a healthy diet and normal weight. Increased weight leads to problems with blood pressure and diabetes.   The Body Mass Index or BMI is a way of measuring how much of your body is fat. Having a BMI above 27 increases the risk of heart disease, diabetes, hypertension, stroke and other problems related to obesity. Your caregiver can help determine your BMI and based on it develop an exercise and dietary program to help you achieve or maintain this important measurement at a healthful level.  High blood pressure causes heart and blood vessel problems.  Persistent high blood pressure should be treated with medicine if weight loss and exercise do not work.   Fat and cholesterol leaves deposits in your arteries   that can block them. This causes heart disease and vessel disease elsewhere in your body.  If your cholesterol is found to be high, or if you have heart disease or certain other medical conditions, then you may need to have your cholesterol monitored frequently and be treated with medication.   Ask if you should have a stress test if your history suggests this. A stress test is a test done on  a treadmill that looks for heart disease. This test can find disease prior to there being a problem.  Avoid drinking alcohol in excess (more than two drinks per day).  Avoid use of street drugs. Do not share needles with anyone. Ask for professional help if you need assistance or instructions on stopping the use of alcohol, cigarettes, and/or drugs.  Brush your teeth twice a day with fluoride toothpaste, and floss once a day. Good oral hygiene prevents tooth decay and gum disease. The problems can be painful, unattractive, and can cause other health problems. Visit your dentist for a routine oral and dental check up and preventive care every 6-12 months.   Look at your skin regularly.  Use a mirror to look at your back. Notify your caregivers of changes in moles, especially if there are changes in shapes, colors, a size larger than a pencil eraser, an irregular border, or development of new moles.  Safety:  Use seatbelts 100% of the time, whether driving or as a passenger.  Use safety devices such as hearing protection if you work in environments with loud noise or significant background noise.  Use safety glasses when doing any work that could send debris in to the eyes.  Use a helmet if you ride a bike or motorcycle.  Use appropriate safety gear for contact sports.  Talk to your caregiver about gun safety.  Use sunscreen with a SPF (or skin protection factor) of 15 or greater.  Lighter skinned people are at a greater risk of skin cancer. Don't forget to also wear sunglasses in order to protect your eyes from too much damaging sunlight. Damaging sunlight can accelerate cataract formation.   Practice safe sex. Use condoms. Condoms are used for birth control and to help reduce the spread of sexually transmitted infections (or STIs).  Some of the STIs are gonorrhea (the clap), chlamydia, syphilis, trichomonas, herpes, HPV (human papilloma virus) and HIV (human immunodeficiency virus) which causes AIDS.  The herpes, HIV and HPV are viral illnesses that have no cure. These can result in disability, cancer and death.   Keep carbon monoxide and smoke detectors in your home functioning at all times. Change the batteries every 6 months or use a model that plugs into the wall.   Vaccinations:  Stay up to date with your tetanus shots and other required immunizations. You should have a booster for tetanus every 10 years. Be sure to get your flu shot every year, since 5%-20% of the [place] population comes down with the flu. The flu vaccine changes each year, so being vaccinated once is not enough. Get your shot in the fall, before the flu season peaks.   Other vaccines to consider:  Pneumococcal vaccine to protect against certain types of pneumonia.  This is normally recommended for adults age 65 or older.  However, adults younger than 77 years old with certain underlying conditions such as diabetes, heart or lung disease should also receive the vaccine.  Shingles vaccine to protect against Varicella Zoster if you are older than age 60, or younger   than 77 years old with certain underlying illness.  Hepatitis A vaccine to protect against a form of infection of the liver by a virus acquired from food.  Hepatitis B vaccine to protect against a form of infection of the liver by a virus acquired from blood or body fluids, particularly if you work in health care.  If you plan to travel internationally, check with your local health department for specific vaccination recommendations.  Cancer Screening:  Most routine colon cancer screening begins at the age of 50. On a yearly basis, doctors may provide special easy to use take-home tests to check for hidden blood in the stool. Sigmoidoscopy or colonoscopy can detect the earliest forms of colon cancer and is life saving. These tests use a small camera at the end of a tube to directly examine the colon. Speak to your caregiver about this at age 50, when routine  screening begins (and is repeated every 5 years unless early forms of pre-cancerous polyps or small growths are found).   At the age of 50 men usually start screening for prostate cancer every year. Screening may begin at a younger age for those with higher risk. Those at higher risk include African-Americans or having a family history of prostate cancer. There are two types of tests for prostate cancer:   Prostate-specific antigen (PSA) testing. Recent studies raise questions about prostate cancer using PSA and you should discuss this with your caregiver.   Digital rectal exam (in which your doctor's lubricated and gloved finger feels for enlargement of the prostate through the anus).   Screening for testicular cancer.  Do a monthly exam of your testicles. Gently roll each testicle between your thumb and fingers, feeling for any abnormal lumps. The best time to do this is after a hot shower or bath when the tissues are looser. Notify your caregivers of any lumps, tenderness or changes in size or shape immediately.     

## 2014-08-02 NOTE — Progress Notes (Signed)
MEDICARE ANNUAL WELLNESS VISIT AND FOLLOW UP Assessment:   1. Essential hypertension - CBC with Differential - BASIC METABOLIC PANEL WITH GFR - Hepatic function panel - TSH  2. Encounter for long-term (current) use of other medications - Magnesium  3. Hyperlipidemia - Lipid panel  4. PreDiabetes Discussed general issues about diabetes pathophysiology and management., Educational material distributed., Suggested low cholesterol diet., Encouraged aerobic exercise., Discussed foot care., Reminded to get yearly retinal exam. - Hemoglobin A1c - Insulin, fasting  5. Vitamin D Deficiency  6. Declines most screening/Health Maintenance Understands risks of not getting colonoscopy, and following medical advisement.   7. Nodule on left post pharynx Declines referral to ENT, will monitor, understands risk of throat/mouth cancer with smoking history  8. Smoking cessation Declines information and declines any discussion   Plan:   During the course of the visit the patient was educated and counseled about appropriate screening and preventive services including:    Pneumococcal vaccine   Influenza vaccine  Td vaccine  Screening electrocardiogram  Colorectal cancer screening  Diabetes screening  Glaucoma screening  Nutrition counseling   Smoking cessation counseling  Screening recommendations, referrals: Vaccinations: Tdap vaccine not indicated Influenza vaccine requested Pneumococcal vaccine not indicated Shingles vaccine not indicated Hep B vaccine not indicated  Nutrition assessed and recommended  Colonoscopy declined Recommended yearly ophthalmology/optometry visit for glaucoma screening and checkup Recommended yearly dental visit for hygiene and checkup Advanced directives - requested  Conditions/risks identified: BMI: Discussed weight loss, diet, and increase physical activity.  Increase physical activity: AHA recommends 150 minutes of physical activity a  week.  Medications reviewed Diabetes is at goal, ACE/ARB therapy: No, Reason not on Ace Inhibitor/ARB therapy:  only pre DM Urinary Incontinence is not an issue: discussed non pharmacology and pharmacology options.  Fall risk: low- discussed PT, home fall assessment, medications.    Subjective:  Bobby Johnson is a 79 y.o. male who presents for Medicare Annual Wellness Visit and 3 month follow up for HTN, hyperlipidemia, prediabetes, and vitamin D Def.  Date of last medicare wellness visit was is unknown.  His blood pressure has been controlled at home, today their BP is BP: 138/72 mmHg He does not workout. He denies chest pain, shortness of breath, dizziness.  He is on cholesterol medication and denies myalgias. His cholesterol is at goal. The cholesterol last visit was:   Lab Results  Component Value Date   CHOL 137 04/15/2014   HDL 59 04/15/2014   LDLCALC 62 04/15/2014   TRIG 82 04/15/2014   CHOLHDL 2.3 04/15/2014   He has been working on diet and exercise for prediabetes, and denies polydipsia, polyuria and visual disturbances. Last A1C in the office was:  Lab Results  Component Value Date   HGBA1C 5.6 04/15/2014   Patient is on Vitamin D supplement.   Lab Results  Component Value Date   VD25OH 35 04/15/2014      Names of Other Physician/Practitioners you currently use: 1. [facility] here for primary care 2. NONE, eye doctor, last visit [facility] cataracts removed 10 years 3. Dr. Mauri Reading, dentist, last visit once a year at least Patient Care Team: Unk Pinto, MD as PCP - General (Internal Medicine) Malka So, MD as Attending Physician (Urology) Bryson Dames, MD (Cardiology) Winfield Cunas., MD as Consulting Physician (Gastroenterology)  Medication Review: Current Outpatient Prescriptions on File Prior to Visit  Medication Sig Dispense Refill  . ALPRAZolam (XANAX) 0.5 MG tablet  Take 1 tablet (0.5 mg total) by  mouth daily as needed for anxiety.  90 tablet  5  . aspirin EC 81 MG tablet Take 81 mg by mouth daily.      . bisoprolol-hydrochlorothiazide (ZIAC) 5-6.25 MG per tablet TAKE ONE TABLET BY MOUTH ONCE DAILY  90 tablet  1  . Cholecalciferol (VITAMIN D PO) Take 5,000 Units by mouth. Takes 3-4 vitamin D per week      . fenofibrate micronized (LOFIBRA) 134 MG capsule Take 1 capsule (134 mg total) by mouth daily before breakfast.  90 capsule  3  . rosuvastatin (CRESTOR) 40 MG tablet Take 40 mg by mouth daily. Takes 1/2 = 20 mg       No current facility-administered medications on file prior to visit.    Current Problems (verified) Patient Active Problem List   Diagnosis Date Noted  . Vitamin D Deficiency 04/15/2014  . PreDiabetes 04/15/2014  . Encounter for long-term (current) use of other medications 04/15/2014  . Hyperlipidemia   . Myocardial infarction   . Enlarged prostate   . History of kidney stones   . Skin cancer   . Hypertension     Screening Tests Health Maintenance  Topic Date Due  . Colonoscopy  09/27/1987  . Influenza Vaccine  07/30/2014  . Tetanus/tdap  12/30/2018  . Pneumococcal Polysaccharide Vaccine Age 34 And Over  Completed  . Zostavax  Completed    Immunization History  Administered Date(s) Administered  . Influenza Split 10/06/2012, 10/05/2013  . Pneumococcal Polysaccharide-23 12/30/2004  . Td 12/30/2001, 12/30/2008  . Zoster 12/30/2010   Preventative care: Last colonoscopy:1989 refuses  Prior vaccinations: TD or Tdap: 2010  Influenza: 2014 Pneumococcal: 2006 Shingles/Zostavax: 2012 Prevnar: discuss at CPE  History reviewed: allergies, current medications, past family history, past medical history, past social history, past surgical history and problem list  Risk Factors: Tobacco History  Substance Use Topics  . Smoking status: Current Every Day Smoker -- 0.75 packs/day  . Smokeless tobacco: Not on file  . Alcohol Use: 4.0 oz/week    8  drink(s) per week     Comment: OCCASIONAL   He does smoke. Are there smokers in your home (other than you)?  Yes  Alcohol Current alcohol use: social drinker  Caffeine Current caffeine use: coffee 3 /day  Exercise Current exercise: none  Nutrition/Diet Current diet: in general, a "healthy" diet    Cardiac risk factors: advanced age (older than 76 for men, 60 for women), dyslipidemia, hypertension, male gender, sedentary lifestyle and smoking/ tobacco exposure.  Depression Screen (Note: if answer to either of the following is "Yes", a more complete depression screening is indicated)   Q1: Over the past two weeks, have you felt down, depressed or hopeless? No  Q2: Over the past two weeks, have you felt little interest or pleasure in doing things? No  Have you lost interest or pleasure in daily life? No  Do you often feel hopeless? No  Do you cry easily over simple problems? No  Activities of Daily Living In your present state of health, do you have any difficulty performing the following activities?:  Driving? No Managing money?  No Feeding yourself? No Getting from bed to chair? No Climbing a flight of stairs? No Preparing food and eating?: No Bathing or showering? No Getting dressed: No Getting to the toilet? No Using the toilet:No Moving around from place to place: No In the past year have you fallen or had a near fall?:No  Are you sexually active?  No  Do you have more than one partner?  No  Vision Difficulties: No  Hearing Difficulties: Yes Do you often ask people to speak up or repeat themselves? No Do you experience ringing or noises in your ears? No Do you have difficulty understanding soft or whispered voices? Yes  Cognition  Do you feel that you have a problem with memory? Yes  Do you often misplace items? No  Do you feel safe at home?  Yes  Advanced directives Does patient have a [place]? Yes Does patient have a Living Will?  Yes   Objective:   Blood pressure 138/72, pulse 64, temperature 98.6 F (37 C), resp. rate 16, height 5' 7.5" (1.715 m), weight 169 lb (76.658 kg). Body mass index is 26.06 kg/(m^2).  General appearance: alert, no distress, WD/WN, male Cognitive Testing  Alert? Yes  Normal Appearance?Yes  Oriented to person? Yes  Place? Yes   Time? Yes  Recall of three objects?  Yes  Can perform simple calculations? Yes  Displays appropriate judgment?Yes  Can read the correct time from a watch face?Yes  HEENT: normocephalic, sclerae anicteric, TMs pearly, nares patent, no discharge or erythema, pharynx normal Oral cavity: MMM, left pharynx at uvula white nodule Neck: supple, no lymphadenopathy, no thyromegaly, no masses, right sided bruit Heart: RRR, normal S1, S2, no murmurs Lungs: CTA bilaterally, no wheezes, rhonchi, or rales Abdomen: +bs, soft, non tender, non distended, no masses, no hepatomegaly, no splenomegaly Musculoskeletal: nontender, no swelling, no obvious deformity Extremities: no edema, no cyanosis, no clubbing Pulses: 2+ symmetric, upper and lower extremities, normal cap refill Neurological: alert, oriented x 3, CN2-12 intact, strength normal upper extremities and lower extremities, sensation normal throughout, DTRs 2+ throughout, no cerebellar signs, gait normal Psychiatric: normal affect, behavior normal, pleasant   Medicare Attestation I have personally reviewed: The patient's medical and social history Their use of alcohol, tobacco or illicit drugs Their current medications and supplements The patient's functional ability including ADLs,fall risks, home safety risks, cognitive, and hearing and visual impairment Diet and physical activities Evidence for depression or mood disorders  The patient's weight, height, BMI, and visual acuity have been recorded in the chart.  I have made referrals, counseling, and provided education to the patient based on review of the above and I  have provided the patient with a written personalized care plan for preventive services.     Vicie Mutters, PA-C   08/02/2014

## 2014-08-03 LAB — INSULIN, FASTING: INSULIN FASTING, SERUM: 15 u[IU]/mL (ref 3–28)

## 2014-09-15 ENCOUNTER — Other Ambulatory Visit: Payer: Self-pay | Admitting: Internal Medicine

## 2014-10-17 ENCOUNTER — Ambulatory Visit: Payer: Self-pay | Admitting: Internal Medicine

## 2014-11-08 ENCOUNTER — Ambulatory Visit (INDEPENDENT_AMBULATORY_CARE_PROVIDER_SITE_OTHER): Payer: Medicare Other | Admitting: Internal Medicine

## 2014-11-08 ENCOUNTER — Encounter: Payer: Self-pay | Admitting: Internal Medicine

## 2014-11-08 VITALS — BP 106/64 | HR 72 | Temp 98.1°F | Resp 16 | Ht 67.5 in | Wt 169.0 lb

## 2014-11-08 DIAGNOSIS — E785 Hyperlipidemia, unspecified: Secondary | ICD-10-CM

## 2014-11-08 DIAGNOSIS — I10 Essential (primary) hypertension: Secondary | ICD-10-CM

## 2014-11-08 DIAGNOSIS — R7309 Other abnormal glucose: Secondary | ICD-10-CM

## 2014-11-08 DIAGNOSIS — R7303 Prediabetes: Secondary | ICD-10-CM

## 2014-11-08 DIAGNOSIS — E559 Vitamin D deficiency, unspecified: Secondary | ICD-10-CM

## 2014-11-08 DIAGNOSIS — Z79899 Other long term (current) drug therapy: Secondary | ICD-10-CM

## 2014-11-08 LAB — CBC WITH DIFFERENTIAL/PLATELET
BASOS ABS: 0 10*3/uL (ref 0.0–0.1)
Basophils Relative: 0 % (ref 0–1)
EOS ABS: 0.2 10*3/uL (ref 0.0–0.7)
Eosinophils Relative: 4 % (ref 0–5)
HCT: 47.4 % (ref 39.0–52.0)
HEMOGLOBIN: 16 g/dL (ref 13.0–17.0)
Lymphocytes Relative: 32 % (ref 12–46)
Lymphs Abs: 1.9 10*3/uL (ref 0.7–4.0)
MCH: 33.3 pg (ref 26.0–34.0)
MCHC: 33.8 g/dL (ref 30.0–36.0)
MCV: 98.5 fL (ref 78.0–100.0)
MONOS PCT: 8 % (ref 3–12)
Monocytes Absolute: 0.5 10*3/uL (ref 0.1–1.0)
Neutro Abs: 3.3 10*3/uL (ref 1.7–7.7)
Neutrophils Relative %: 56 % (ref 43–77)
PLATELETS: 148 10*3/uL — AB (ref 150–400)
RBC: 4.81 MIL/uL (ref 4.22–5.81)
RDW: 14.2 % (ref 11.5–15.5)
WBC: 5.9 10*3/uL (ref 4.0–10.5)

## 2014-11-08 LAB — HEPATIC FUNCTION PANEL
ALT: 17 U/L (ref 0–53)
AST: 32 U/L (ref 0–37)
Albumin: 4 g/dL (ref 3.5–5.2)
Alkaline Phosphatase: 48 U/L (ref 39–117)
BILIRUBIN DIRECT: 0.2 mg/dL (ref 0.0–0.3)
BILIRUBIN INDIRECT: 0.6 mg/dL (ref 0.2–1.2)
BILIRUBIN TOTAL: 0.8 mg/dL (ref 0.2–1.2)
Total Protein: 6.4 g/dL (ref 6.0–8.3)

## 2014-11-08 LAB — LIPID PANEL
Cholesterol: 122 mg/dL (ref 0–200)
HDL: 63 mg/dL (ref >39)
LDL CALC: 42 mg/dL (ref 0–99)
Total CHOL/HDL Ratio: 1.9 Ratio
Triglycerides: 83 mg/dL (ref <150)
VLDL: 17 mg/dL (ref 0–40)

## 2014-11-08 LAB — BASIC METABOLIC PANEL WITH GFR
BUN: 17 mg/dL (ref 6–23)
CALCIUM: 9.4 mg/dL (ref 8.4–10.5)
CO2: 31 mEq/L (ref 19–32)
Chloride: 107 mEq/L (ref 96–112)
Creat: 1.11 mg/dL (ref 0.50–1.35)
GFR, EST NON AFRICAN AMERICAN: 64 mL/min
GFR, Est African American: 74 mL/min
GLUCOSE: 93 mg/dL (ref 70–99)
POTASSIUM: 4.1 meq/L (ref 3.5–5.3)
Sodium: 142 mEq/L (ref 135–145)

## 2014-11-08 LAB — MAGNESIUM: Magnesium: 1.8 mg/dL (ref 1.5–2.5)

## 2014-11-08 LAB — TSH: TSH: 1.932 u[IU]/mL (ref 0.350–4.500)

## 2014-11-08 MED ORDER — MECLIZINE HCL 25 MG PO TABS: ORAL_TABLET | ORAL | Status: DC

## 2014-11-08 MED ORDER — NITROGLYCERIN 0.4 MG SL SUBL: SUBLINGUAL_TABLET | SUBLINGUAL | Status: AC

## 2014-11-08 MED ORDER — PREDNISONE 20 MG PO TABS: ORAL_TABLET | ORAL | Status: DC

## 2014-11-08 NOTE — Patient Instructions (Signed)

## 2014-11-08 NOTE — Progress Notes (Signed)
Patient ID: Bobby Johnson, male   DOB: 1935/04/04, 79 y.o.   MRN: [id number]   This very nice 79 y.o.MWM presents for 3 month follow up with Hypertension, Hyperlipidemia, Pre-Diabetes and Vitamin D Deficiency. Patient is also c/o 4-5 day hx/o positional vertigo   Patient is treated for HTN & BP has been controlled at home. Today's BP: 106/64 mmHg. Patient did have a mI in 1986 treated medically and he's done well since. Patient has had no complaints of any cardiac type chest pain, palpitations, dyspnea/orthopnea/PND, dizziness, claudication, or dependent edema.   Hyperlipidemia is controlled with diet & meds. Patient denies myalgias or other med SE's. Last Lipids were Total: Chol 111; HDL 52; LDL 42; Trig 86 on 08/02/2014.   Also, the patient has history of PreDiabetes and has had no symptoms of reactive hypoglycemia, diabetic polys, paresthesias or visual blurring.  Last A1c was 5.6% on  8 /03/2014.   Further, the patient also has history of Vitamin D Deficiency and supplements vitamin D without any suspected side-effects. Last vitamin D was 35 on  04/15/2014.   Medication List   aspirin EC 81 MG tablet  Take 81 mg by mouth daily.     bisoprolol-hydrochlorothiazide 5-6.25 MG per tablet  Commonly known as:  ZIAC  TAKE ONE TABLET BY MOUTH ONCE DAILY     CRESTOR 40 MG tablet  Generic drug:  rosuvastatin  TAKE ONE TABLET BY MOUTH EVERY DAY FOR CHOLESTEROL     fenofibrate micronized 134 MG capsule  Commonly known as:  LOFIBRA  Take 1 capsule (134 mg total) by mouth daily before breakfast.     VITAMIN D PO  Take 5,000 Units by mouth. Takes 3-4 vitamin D per week     Allergies  Allergen Reactions  . Doxazosin     syncope  . Fish Oil     GI upset  . Flax Seeds [Flaxseed (Linseed)]     GI upset  . Lipitor [Atorvastatin]     myalgias   PMHx:   Past Medical History  Diagnosis Date  . Hypertension   . Hyperlipidemia   . Myocardial infarction 1986  . History of skin cancer 1995  .  Bladder stone   . Enlarged prostate   . Frequency of urination   . History of kidney stones   . Cancer     SKIN   . Vitamin D deficiency    Immunization History  Administered Date(s) Administered  . Influenza Split 10/06/2012, 10/05/2013  . Pneumococcal Polysaccharide-23 12/30/2004  . Td 12/30/2001, 12/30/2008  . Zoster 12/30/2010   Past Surgical History  Procedure Laterality Date  . Lithotripsy    . Cataracts      REMOVED  . Skin cancer excision    . Cystoscopy with litholapaxy N/A 03/11/2013    Procedure: CYSTOSCOPY WITH LITHOLAPAXY;  Surgeon: Malka So, MD;  Location: [facility];  Service: Urology;  Laterality: N/A;  90 mins requested for this case    . Holmium laser application N/A 07/14/9677    Procedure: HOLMIUM LASER APPLICATION;  Surgeon: Malka So, MD;  Location: [facility];  Service: Urology;  Laterality: N/A;  . Transurethral resection of prostate N/A 03/11/2013    Procedure: TRANSURETHRAL RESECTION OF THE PROSTATE WITH GYRUS INSTRUMENTS;  Surgeon: Malka So, MD;  Location: [facility];  Service: Urology;  Laterality: N/A;   FHx:    Reviewed / unchanged  SHx:    Reviewed / unchanged  Systems Review:  Constitutional: Denies fever,  chills, wt changes, headaches, insomnia, fatigue, night sweats, change in appetite. Eyes: Denies redness, blurred vision, diplopia, discharge, itchy, watery eyes.  ENT: Denies discharge, congestion, post nasal drip, epistaxis, sore throat, earache, hearing loss, dental pain, tinnitus,  sinus pain, snoring. Does c/o positional vertigo. CV: Denies chest pain, palpitations, irregular heartbeat, syncope, dyspnea, diaphoresis, orthopnea, PND, claudication or edema. Respiratory: denies cough, dyspnea, DOE, pleurisy, hoarseness, laryngitis, wheezing.  Gastrointestinal: Denies dysphagia, odynophagia, heartburn, reflux, water brash, abdominal pain or cramps, nausea, vomiting, bloating, diarrhea, constipation, hematemesis, melena, hematochezia  or  hemorrhoids. Genitourinary: Denies dysuria, frequency, urgency, nocturia, hesitancy, discharge, hematuria or flank pain. Musculoskeletal: Denies arthralgias, myalgias, stiffness, jt. swelling, pain, limping or strain/sprain.  Skin: Denies pruritus, rash, hives, warts, acne, eczema or change in skin lesion(s). Neuro: No weakness, tremor, incoordination, spasms, paresthesia or pain. Psychiatric: Denies confusion, memory loss or sensory loss. Endo: Denies change in weight, skin or hair change.  Heme/Lymph: No excessive bleeding, bruising or enlarged lymph nodes.  Exam:  BP 106/64 mmHg  Pulse 72  Temp(Src) 98.1 F (36.7 C)  Resp 16  Ht 5' 7.5" (1.715 m)  Wt 169 lb (76.658 kg)  BMI 26.06 kg/m2  Appears well nourished and in no distress. Eyes: PERRLA, EOMs, conjunctiva no swelling or erythema. Sinuses: No frontal/maxillary tenderness ENT/Mouth: EAC's clear, TM's nl w/o erythema, bulging. Nares clear w/o erythema, swelling, exudates. Oropharynx clear without erythema or exudates. Oral hygiene is good. Tongue normal, non obstructing. Hearing intact.  Neck: Supple. Thyroid nl. Car 2+/2+ without bruits, nodes or JVD. Chest: Respirations nl with BS clear & equal w/o rales, rhonchi, wheezing or stridor.  Cor: Heart sounds normal w/ regular rate and rhythm without sig. murmurs, gallops, clicks, or rubs. Peripheral pulses normal and equal  without edema.  Abdomen: Soft & bowel sounds normal. Non-tender w/o guarding, rebound, hernias, masses, or organomegaly.  Lymphatics: Unremarkable.  Musculoskeletal: Full ROM all peripheral extremities, joint stability, 5/5 strength, and normal gait.  Skin: Warm, dry without exposed rashes, lesions or ecchymosis apparent.  Neuro: Cranial nerves intact, reflexes equal bilaterally. Sensory-motor testing grossly intact. Tendon reflexes grossly intact.  Pysch: Alert & oriented x 3.  Insight and judgement nl & appropriate. No ideations.  Assessment and Plan:  1.  Hypertension - Continue monitor blood pressure at home. Continue diet/meds same.  2. Hyperlipidemia - Continue diet/meds, exercise,& lifestyle modifications. Continue monitor periodic cholesterol/liver & renal functions   3. Pre-Diabetes - Continue diet, exercise, lifestyle modifications. Monitor appropriate labs.  4. Vitamin D Deficiency - Continue supplementation.  5. Vertigo, Positional - Rx Meclizine & Prednisone pulse/taper   Recommended regular exercise, BP monitoring, weight control, and discussed med and SE's. Recommended labs to assess and monitor clinical status. Further disposition pending results of labs.

## 2014-11-09 LAB — HEMOGLOBIN A1C
HEMOGLOBIN A1C: 5.7 % — AB (ref <5.7)
MEAN PLASMA GLUCOSE: 117 mg/dL — AB (ref <117)

## 2014-11-09 LAB — INSULIN, FASTING: Insulin fasting, serum: 10 u[IU]/mL (ref 2.0–19.6)

## 2014-11-09 LAB — VITAMIN D 25 HYDROXY (VIT D DEFICIENCY, FRACTURES): VIT D 25 HYDROXY: 40 ng/mL (ref 30–89)

## 2014-11-15 ENCOUNTER — Other Ambulatory Visit: Payer: Self-pay | Admitting: *Deleted

## 2014-11-15 MED ORDER — ROSUVASTATIN CALCIUM 40 MG PO TABS: 40.0000 mg | ORAL_TABLET | Freq: Every day | ORAL | Status: DC

## 2015-01-02 ENCOUNTER — Other Ambulatory Visit: Payer: Self-pay | Admitting: Internal Medicine

## 2015-02-14 ENCOUNTER — Encounter: Payer: Self-pay | Admitting: Internal Medicine

## 2015-02-14 ENCOUNTER — Ambulatory Visit (INDEPENDENT_AMBULATORY_CARE_PROVIDER_SITE_OTHER): Payer: Medicare Other | Admitting: Internal Medicine

## 2015-02-14 VITALS — BP 116/74 | HR 74 | Temp 97.7°F | Resp 16 | Ht 67.5 in | Wt 173.6 lb

## 2015-02-14 DIAGNOSIS — Z0000 Encounter for general adult medical examination without abnormal findings: Secondary | ICD-10-CM

## 2015-02-14 DIAGNOSIS — Z79899 Other long term (current) drug therapy: Secondary | ICD-10-CM

## 2015-02-14 DIAGNOSIS — R7303 Prediabetes: Secondary | ICD-10-CM

## 2015-02-14 DIAGNOSIS — E559 Vitamin D deficiency, unspecified: Secondary | ICD-10-CM

## 2015-02-14 DIAGNOSIS — E785 Hyperlipidemia, unspecified: Secondary | ICD-10-CM

## 2015-02-14 DIAGNOSIS — I2510 Atherosclerotic heart disease of native coronary artery without angina pectoris: Secondary | ICD-10-CM | POA: Insufficient documentation

## 2015-02-14 DIAGNOSIS — I10 Essential (primary) hypertension: Secondary | ICD-10-CM

## 2015-02-14 DIAGNOSIS — Z9181 History of falling: Secondary | ICD-10-CM

## 2015-02-14 DIAGNOSIS — Z1331 Encounter for screening for depression: Secondary | ICD-10-CM

## 2015-02-14 LAB — CBC WITH DIFFERENTIAL/PLATELET
BASOS ABS: 0.1 10*3/uL (ref 0.0–0.1)
Basophils Relative: 1 % (ref 0–1)
EOS ABS: 0.2 10*3/uL (ref 0.0–0.7)
EOS PCT: 3 % (ref 0–5)
HCT: 45.1 % (ref 39.0–52.0)
HEMOGLOBIN: 15.1 g/dL (ref 13.0–17.0)
Lymphocytes Relative: 31 % (ref 12–46)
Lymphs Abs: 1.9 10*3/uL (ref 0.7–4.0)
MCH: 33.9 pg (ref 26.0–34.0)
MCHC: 33.5 g/dL (ref 30.0–36.0)
MCV: 101.1 fL — ABNORMAL HIGH (ref 78.0–100.0)
MONO ABS: 0.7 10*3/uL (ref 0.1–1.0)
MPV: 12.4 fL (ref 8.6–12.4)
Monocytes Relative: 11 % (ref 3–12)
Neutro Abs: 3.3 10*3/uL (ref 1.7–7.7)
Neutrophils Relative %: 54 % (ref 43–77)
Platelets: 171 10*3/uL (ref 150–400)
RBC: 4.46 MIL/uL (ref 4.22–5.81)
RDW: 14.5 % (ref 11.5–15.5)
WBC: 6.2 10*3/uL (ref 4.0–10.5)

## 2015-02-14 LAB — HEMOGLOBIN A1C
Hgb A1c MFr Bld: 5.4 % (ref <5.7)
Mean Plasma Glucose: 108 mg/dL (ref <117)

## 2015-02-14 NOTE — Progress Notes (Signed)
Patient ID: Bobby Johnson, male   DOB: 06/10/1935, 80 y.o.   MRN: [id number]  MEDICARE ANNUAL WELLNESS VISIT AND OV  Assessment:   1. Essential hypertension  - TSH  2. ASHD   3. Hyperlipidemia  - Lipid panel  4. Prediabetes  - Hemoglobin A1c - Insulin, fasting  5. Vitamin D deficiency  - Vit D  25 hydroxy (rtn osteoporosis monitoring)  6. Medication management  - CBC with Differential/Platelet - BASIC METABOLIC PANEL WITH GFR - Hepatic function panel - Magnesium  7. Routine general medical examination at a health care facility   8. Depression screen  - Negative Screen  9. At low risk for fall   Plan:   During the course of the visit the patient was educated and counseled about appropriate screening and preventive services including:    Pneumococcal vaccine   Influenza vaccine  Td vaccine  Screening electrocardiogram  Bone densitometry screening  Colorectal cancer screening  Diabetes screening  Glaucoma screening  Nutrition counseling   Advanced directives: requested  Screening recommendations, referrals: Vaccinations: Immunization History  Administered Date(s) Administered  . Influenza Split 10/06/2012, 10/05/2013, 12/13/2014  . Pneumococcal Polysaccharide-23 12/30/2004  . Td 12/30/2001, 12/30/2008  . Zoster 12/30/2010   Prevnar vaccine undecided Hep B vaccine not indicated  Nutrition assessed and recommended  Colonoscopy 1989 & hs refused repeatedly since Recommended yearly ophthalmology/optometry visit for glaucoma screening and checkup Recommended yearly dental visit for hygiene and checkup Advanced directives - yes  Conditions/risks identified: BMI: Discussed weight loss, diet, and increase physical activity.  Increase physical activity: AHA recommends 150 minutes of physical activity a week.  Medications reviewed PreDiabetes is at goal, ACE/ARB therapy: Not Indicated Urinary Incontinence is not an issue: discussed non  pharmacology and pharmacology options.  Fall risk: low- discussed PT, home fall assessment, medications.   Subjective:  Bobby Johnson is a 80 y.o. MWM who presents for Medicare Annual Wellness Visit and complete physical.  Date of last medicare wellness visit is unknown.  He has had elevated blood pressure since 1986. His blood pressure has been controlled at home, today their BP is BP: 116/74 mmHg He does not workout. He denies chest pain, shortness of breath, dizziness.  He is on cholesterol medication and denies myalgias. His cholesterol is at goal. The cholesterol last visit was:  Lab Results  Component Value Date   CHOL 122 11/08/2014   HDL 63 11/08/2014   LDLCALC 42 11/08/2014   TRIG 83 11/08/2014   CHOLHDL 1.9 11/08/2014   He has had prediabetes for 5 years since June 2011 with an A1c 5.7%. He has not been working on diet and exercise for prediabetes and denies foot ulcerations, hyperglycemia, hypoglycemia , paresthesia of the feet, polydipsia, polyuria and visual disturbances. Last A1C in the office was:  Lab Results  Component Value Date   HGBA1C 5.7* 11/08/2014   Patient is on Vitamin D supplement.  (Vit D 28 in 2008) Lab Results  Component Value Date   VD25OH 40 11/08/2014      Names of Other Physician/Practitioners you currently use: 1. [facility] here for primary care 2. No eye doctor, refuses referral 3. Dr Jerilynn Birkenhead (?) dentist, last visit 2015  Patient Care Team: Unk Pinto, MD as PCP - General (Internal Medicine) Malka So, MD as Attending Physician (Urology) Bryson Dames, MD (Cardiology) Winfield Cunas., MD as Consulting Physician (Gastroenterology)  Medication Review: Current Outpatient Prescriptions on File Prior to Visit  Medication Sig Dispense Refill  . ALPRAZolam (XANAX) 0.5 MG tablet Take 1 tablet (0.5 mg total) by mouth daily as needed for anxiety. 90 tablet 5  . aspirin EC 81 MG tablet Take 81 mg  by mouth daily.    . bisoprolol-hydrochlorothiazide (ZIAC) 5-6.25 MG per tablet TAKE ONE TABLET BY MOUTH ONCE DAILY 90 tablet 0  . Cholecalciferol (VITAMIN D PO) Take 5,000 Units by mouth. Takes 3-4 vitamin D per week    . fenofibrate micronized (LOFIBRA) 134 MG capsule Take 1 capsule (134 mg total) by mouth daily before breakfast. 90 capsule 3  . nitroGLYCERIN (NITROSTAT) 0.4 MG SL tablet Dissolve 1 tablet under tongue every 3 min if needed for Angina 50 tablet 99  . rosuvastatin (CRESTOR) 40 MG tablet Take 1 tablet (40 mg total) by mouth daily. for cholesterol 30 tablet 3   No current facility-administered medications on file prior to visit.    Current Problems (verified) Patient Active Problem List   Diagnosis Date Noted  . ASHD 02/14/2015  . Vitamin D deficiency 04/15/2014  . Prediabetes 04/15/2014  . Medication management 04/15/2014  . Hyperlipidemia   . History of kidney stones   . Skin cancer   . Hypertension     Screening Tests Health Maintenance  Topic Date Due  . COLONOSCOPY  09/27/1987  . INFLUENZA VACCINE  07/31/2015  . TETANUS/TDAP  12/30/2018  . PNEUMOCOCCAL POLYSACCHARIDE VACCINE AGE 64 AND OVER  Completed  . ZOSTAVAX  Completed    Immunization History  Administered Date(s) Administered  . Influenza Split 10/06/2012, 10/05/2013, 12/13/2014  . Pneumococcal Polysaccharide-23 12/30/2004  . Td 12/30/2001, 12/30/2008  . Zoster 12/30/2010    Preventative care: Last colonoscopy: 1989 - Repeatedly refuses f/u.  History reviewed: allergies, current medications, past family history, past medical history, past social history, past surgical history and problem list  Risk Factors: Tobacco History  Substance Use Topics  . Smoking status: Current Every Day Smoker -- 0.75 packs/day  . Smokeless tobacco: Not on file  . Alcohol Use: 4.0 oz/week    8 drink(s) per week     Comment: OCCASIONAL   He does smoke.  Patient is recalcitrant to stopping smoking. Are there  smokers in your home (other than you)?  No  Alcohol Current alcohol use: history unreliable - Patient admits  1-2 shots 2-3 x week & wife has reported deeply intoxicated most days.   Caffeine Current caffeine use: coffee 3 cups /day  Exercise Current exercise: none  Nutrition/Diet Current diet: in general, a "healthy" diet    Cardiac risk factors: advanced age (older than 39 for men, 61 for women), dyslipidemia, hypertension, male gender, sedentary lifestyle and smoking/ tobacco exposure.  Depression Screen (Note: if answer to either of the following is "Yes", a more complete depression screening is indicated)   Q1: Over the past two weeks, have you felt down, depressed or hopeless? No  Q2: Over the past two weeks, have you felt little interest or pleasure in doing things? No  Have you lost interest or pleasure in daily life? No  Do you often feel hopeless? No  Do you cry easily over simple problems? No  Activities of Daily Living In your present state of health, do you have any difficulty performing the following activities?:  Driving? No Managing money?  No Feeding yourself? No Getting from bed to chair? No Climbing a flight of stairs? No Preparing food and eating?: No Bathing or showering? No Getting dressed: No Getting to the toilet?  No Using the toilet:No Moving around from place to place: No In the past year have you fallen or had a near fall?:No   Are you sexually active?  Yes  Do you have more than one partner?  No  Vision Difficulties: No  Hearing Difficulties: No Do you often ask people to speak up or repeat themselves? No Do you experience ringing or noises in your ears? No Do you have difficulty understanding soft or whispered voices? No  Cognition  Do you feel that you have a problem with memory?No  Do you often misplace items? No  Do you feel safe at home?  Yes  Advanced directives Does patient have a [place]? Yes Does  patient have a Living Will? Yes  Past Medical History  Diagnosis Date  . Hypertension   . Hyperlipidemia   . Myocardial infarction 1986  . History of skin cancer 1995  . Bladder stone   . Enlarged prostate   . Frequency of urination   . History of kidney stones   . Cancer     SKIN   . Vitamin D deficiency    Past Surgical History  Procedure Laterality Date  . Lithotripsy    . Cataracts      REMOVED  . Skin cancer excision    . Cystoscopy with litholapaxy N/A 03/11/2013    Procedure: CYSTOSCOPY WITH LITHOLAPAXY;  Surgeon: Malka So, MD;  Location: [facility];  Service: Urology;  Laterality: N/A;  90 mins requested for this case    . Holmium laser application N/A 6/38/7564    Procedure: HOLMIUM LASER APPLICATION;  Surgeon: Malka So, MD;  Location: [facility];  Service: Urology;  Laterality: N/A;  . Transurethral resection of prostate N/A 03/11/2013    Procedure: TRANSURETHRAL RESECTION OF THE PROSTATE WITH GYRUS INSTRUMENTS;  Surgeon: Malka So, MD;  Location: [facility];  Service: Urology;  Laterality: N/A;    ROS: Constitutional: Denies fever, chills, weight loss/gain, headaches, insomnia, fatigue, night sweats or change in appetite. Eyes: Denies redness, blurred vision, diplopia, discharge, itchy or watery eyes.  ENT: Denies discharge, congestion, post nasal drip, epistaxis, sore throat, earache, hearing loss, dental pain, Tinnitus, Vertigo, Sinus pain or snoring.  Cardio: Denies chest pain, palpitations, irregular heartbeat, syncope, dyspnea, diaphoresis, orthopnea, PND, claudication or edema Respiratory: denies cough, dyspnea, DOE, pleurisy, hoarseness, laryngitis or wheezing.  Gastrointestinal: Denies dysphagia, heartburn, reflux, water brash, pain, cramps, nausea, vomiting, bloating, diarrhea, constipation, hematemesis, melena, hematochezia, jaundice or hemorrhoids Genitourinary: Denies dysuria, frequency, urgency, nocturia, hesitancy, discharge, hematuria or flank  pain Musculoskeletal: Denies arthralgia, myalgia, stiffness, Jt. Swelling, pain, limp or strain/sprain. Denies Falls. Skin: Denies puritis, rash, hives, warts, acne, eczema or change in skin lesion Neuro: No weakness, tremor, incoordination, spasms, paresthesia or pain Psychiatric: Denies confusion, memory loss or sensory loss. Denies Depression. Endocrine: Denies change in weight, skin, hair change, nocturia, and paresthesia, diabetic polys, visual blurring or hyper / hypo glycemic episodes.  Heme/Lymph: No excessive bleeding, bruising or enlarged lymph nodes.  Objective:     BP 116/74   Pulse 74  Temp 97.7 F   Resp 16  Ht 5' 7.5"   Wt 173 lb 9.6 oz     BMI 26.77   General Appearance:  Alert  WD/WN, male , in no apparent distress. Eyes: PERRLA, EOMs, conjunctiva no swelling or erythema, normal fundi and vessels. Sinuses: No frontal/maxillary tenderness ENT/Mouth: EACs patent / TMs  nl. Nares clear without erythema, swelling, mucoid exudates.  Oral hygiene is good. No erythema, swelling, or exudate. Tongue normal, non-obstructing. Tonsils not swollen or erythematous. Hearing normal.  Neck: Supple, thyroid normal. No bruits, nodes or JVD. Respiratory: Respiratory effort normal.  BS equal and clear bilateral without rales, rhonci, wheezing or stridor. Cardio: Heart sounds are normal with regular rate and rhythm and no murmurs, rubs or gallops. Peripheral pulses are normal and equal bilaterally without edema. No aortic or femoral bruits. Chest: symmetric with normal excursions and percussion.  Abdomen: Flat, soft, with nl bowel sounds. Nontender, no guarding, rebound, hernias, masses, or organomegaly.  Lymphatics: Non tender without lymphadenopathy.  Musculoskeletal: Full ROM all peripheral extremities, joint stability, 5/5 strength, and normal gait. Skin: Warm and dry without rashes, lesions, cyanosis, clubbing or  ecchymosis.  Neuro: Cranial nerves intact, reflexes equal bilaterally.  Normal muscle tone, no cerebellar symptoms. Sensation intact.  Pysch: Awake and oriented X 3 with normal affect, insight and judgment appropriate.   Cognitive Testing  Alert? Yes  Normal Appearance? Yes  Oriented to person? Yes  Place? Yes   Time? Yes  Recall of three objects?  Yes  Can perform simple calculations? Yes  Displays appropriate judgment? Yes  Can read the correct time from a watch/clock? Yes  Medicare Attestation I have personally reviewed: The patient's medical and social history Their use of alcohol, tobacco or illicit drugs Their current medications and supplements The patient's functional ability including ADLs,fall risks, home safety risks, cognitive, and hearing and visual impairment Diet and physical activities Evidence for depression or mood disorders  The patient's weight, height, BMI, and visual acuity have been recorded in the chart.  I have made referrals, counseling, and provided education to the patient based on review of the above and I have provided the patient with a written personalized care plan for preventive services.    Deshanae Lindo DAVID, MD   02/14/2015

## 2015-02-15 LAB — HEPATIC FUNCTION PANEL
ALT: 13 U/L (ref 0–53)
AST: 26 U/L (ref 0–37)
Albumin: 3.9 g/dL (ref 3.5–5.2)
Alkaline Phosphatase: 46 U/L (ref 39–117)
BILIRUBIN DIRECT: 0.3 mg/dL (ref 0.0–0.3)
BILIRUBIN TOTAL: 0.8 mg/dL (ref 0.2–1.2)
Indirect Bilirubin: 0.5 mg/dL (ref 0.2–1.2)
Total Protein: 6.4 g/dL (ref 6.0–8.3)

## 2015-02-15 LAB — BASIC METABOLIC PANEL WITH GFR
BUN: 20 mg/dL (ref 6–23)
CO2: 28 mEq/L (ref 19–32)
CREATININE: 1.04 mg/dL (ref 0.50–1.35)
Calcium: 9 mg/dL (ref 8.4–10.5)
Chloride: 106 mEq/L (ref 96–112)
GFR, EST AFRICAN AMERICAN: 80 mL/min
GFR, EST NON AFRICAN AMERICAN: 69 mL/min
GLUCOSE: 96 mg/dL (ref 70–99)
Potassium: 4.2 mEq/L (ref 3.5–5.3)
Sodium: 143 mEq/L (ref 135–145)

## 2015-02-15 LAB — LIPID PANEL
Cholesterol: 118 mg/dL (ref 0–200)
HDL: 51 mg/dL (ref >39)
LDL CALC: 49 mg/dL (ref 0–99)
Total CHOL/HDL Ratio: 2.3 Ratio
Triglycerides: 90 mg/dL (ref <150)
VLDL: 18 mg/dL (ref 0–40)

## 2015-02-15 LAB — VITAMIN D 25 HYDROXY (VIT D DEFICIENCY, FRACTURES): Vit D, 25-Hydroxy: 40 ng/mL (ref 30–100)

## 2015-02-15 LAB — TSH: TSH: 1.385 u[IU]/mL (ref 0.350–4.500)

## 2015-02-15 LAB — INSULIN, FASTING: Insulin fasting, serum: 9.1 u[IU]/mL (ref 2.0–19.6)

## 2015-02-15 LAB — MAGNESIUM: MAGNESIUM: 1.8 mg/dL (ref 1.5–2.5)

## 2015-04-04 ENCOUNTER — Other Ambulatory Visit: Payer: Self-pay | Admitting: Internal Medicine

## 2015-04-04 ENCOUNTER — Other Ambulatory Visit: Payer: Self-pay | Admitting: Physician Assistant

## 2015-04-04 DIAGNOSIS — F411 Generalized anxiety disorder: Secondary | ICD-10-CM

## 2015-04-04 DIAGNOSIS — I10 Essential (primary) hypertension: Secondary | ICD-10-CM

## 2015-04-07 ENCOUNTER — Other Ambulatory Visit: Payer: Self-pay | Admitting: Internal Medicine

## 2015-04-07 DIAGNOSIS — F411 Generalized anxiety disorder: Secondary | ICD-10-CM

## 2015-04-08 ENCOUNTER — Other Ambulatory Visit: Payer: Self-pay | Admitting: Internal Medicine

## 2015-04-18 ENCOUNTER — Encounter: Payer: Self-pay | Admitting: Internal Medicine

## 2015-05-16 ENCOUNTER — Encounter: Payer: Self-pay | Admitting: Physician Assistant

## 2015-05-16 ENCOUNTER — Ambulatory Visit (INDEPENDENT_AMBULATORY_CARE_PROVIDER_SITE_OTHER): Payer: Medicare Other | Admitting: Physician Assistant

## 2015-05-16 VITALS — BP 120/70 | HR 64 | Temp 97.7°F | Resp 16 | Ht 67.5 in | Wt 175.0 lb

## 2015-05-16 DIAGNOSIS — E559 Vitamin D deficiency, unspecified: Secondary | ICD-10-CM

## 2015-05-16 DIAGNOSIS — E785 Hyperlipidemia, unspecified: Secondary | ICD-10-CM

## 2015-05-16 DIAGNOSIS — Z79899 Other long term (current) drug therapy: Secondary | ICD-10-CM

## 2015-05-16 DIAGNOSIS — I2510 Atherosclerotic heart disease of native coronary artery without angina pectoris: Secondary | ICD-10-CM | POA: Diagnosis not present

## 2015-05-16 DIAGNOSIS — R7303 Prediabetes: Secondary | ICD-10-CM

## 2015-05-16 DIAGNOSIS — I10 Essential (primary) hypertension: Secondary | ICD-10-CM

## 2015-05-16 DIAGNOSIS — Z87442 Personal history of urinary calculi: Secondary | ICD-10-CM

## 2015-05-16 DIAGNOSIS — C4490 Unspecified malignant neoplasm of skin, unspecified: Secondary | ICD-10-CM

## 2015-05-16 DIAGNOSIS — R7309 Other abnormal glucose: Secondary | ICD-10-CM | POA: Diagnosis not present

## 2015-05-16 LAB — HEPATIC FUNCTION PANEL
ALBUMIN: 3.5 g/dL (ref 3.5–5.2)
ALT: 30 U/L (ref 0–53)
AST: 46 U/L — ABNORMAL HIGH (ref 0–37)
Alkaline Phosphatase: 36 U/L — ABNORMAL LOW (ref 39–117)
BILIRUBIN DIRECT: 0.4 mg/dL — AB (ref 0.0–0.3)
BILIRUBIN TOTAL: 1.1 mg/dL (ref 0.2–1.2)
Indirect Bilirubin: 0.7 mg/dL (ref 0.2–1.2)
TOTAL PROTEIN: 6.1 g/dL (ref 6.0–8.3)

## 2015-05-16 LAB — LIPID PANEL
Cholesterol: 113 mg/dL (ref 0–200)
HDL: 56 mg/dL (ref >=40)
LDL CALC: 41 mg/dL (ref 0–99)
Total CHOL/HDL Ratio: 2 Ratio
Triglycerides: 79 mg/dL (ref <150)
VLDL: 16 mg/dL (ref 0–40)

## 2015-05-16 LAB — BASIC METABOLIC PANEL WITH GFR
BUN: 15 mg/dL (ref 6–23)
CALCIUM: 9 mg/dL (ref 8.4–10.5)
CO2: 30 mEq/L (ref 19–32)
Chloride: 106 mEq/L (ref 96–112)
Creat: 1.12 mg/dL (ref 0.50–1.35)
GFR, EST NON AFRICAN AMERICAN: 63 mL/min
GFR, Est African American: 73 mL/min
GLUCOSE: 87 mg/dL (ref 70–99)
Potassium: 4 mEq/L (ref 3.5–5.3)
SODIUM: 143 meq/L (ref 135–145)

## 2015-05-16 LAB — MAGNESIUM: MAGNESIUM: 1.8 mg/dL (ref 1.5–2.5)

## 2015-05-16 LAB — CBC WITH DIFFERENTIAL/PLATELET
BASOS ABS: 0.1 10*3/uL (ref 0.0–0.1)
Basophils Relative: 1 % (ref 0–1)
Eosinophils Absolute: 0.2 10*3/uL (ref 0.0–0.7)
Eosinophils Relative: 3 % (ref 0–5)
HCT: 43.1 % (ref 39.0–52.0)
Hemoglobin: 14.2 g/dL (ref 13.0–17.0)
LYMPHS PCT: 33 % (ref 12–46)
Lymphs Abs: 1.7 10*3/uL (ref 0.7–4.0)
MCH: 33 pg (ref 26.0–34.0)
MCHC: 32.9 g/dL (ref 30.0–36.0)
MCV: 100.2 fL — ABNORMAL HIGH (ref 78.0–100.0)
MPV: 12.1 fL (ref 8.6–12.4)
Monocytes Absolute: 0.5 10*3/uL (ref 0.1–1.0)
Monocytes Relative: 10 % (ref 3–12)
Neutro Abs: 2.7 10*3/uL (ref 1.7–7.7)
Neutrophils Relative %: 53 % (ref 43–77)
Platelets: 145 10*3/uL — ABNORMAL LOW (ref 150–400)
RBC: 4.3 MIL/uL (ref 4.22–5.81)
RDW: 14.1 % (ref 11.5–15.5)
WBC: 5 10*3/uL (ref 4.0–10.5)

## 2015-05-16 LAB — TSH: TSH: 1.355 u[IU]/mL (ref 0.350–4.500)

## 2015-05-16 LAB — HEMOGLOBIN A1C
Hgb A1c MFr Bld: 5.6 % (ref <5.7)
Mean Plasma Glucose: 114 mg/dL (ref <117)

## 2015-05-16 NOTE — Patient Instructions (Signed)
Benefiber is good for constipation/diarrhea/irritable bowel syndrome, it helps with weight loss and can help lower your bad cholesterol. Please do 1-2 TBSP in the morning in water, coffee, or tea. It can take up to a month before you can see a difference with your bowel movements. It is cheapest from [employer], [employer], [employer].    Cholesterol Cholesterol is a white, waxy, fat-like substance needed by your body in small amounts. The liver makes all the cholesterol you need. Cholesterol is carried from the liver by the blood through the blood vessels. Deposits of cholesterol (plaque) may build up on blood vessel walls. These make the arteries narrower and stiffer. Cholesterol plaques increase the risk for heart attack and stroke.  You cannot feel your cholesterol level even if it is very high. The only way to know it is high is with a blood test. Once you know your cholesterol levels, you should keep a record of the test results. Work with your health care provider to keep your levels in the desired range.  WHAT DO THE RESULTS MEAN?  Total cholesterol is a rough measure of all the cholesterol in your blood.   LDL is the so-called bad cholesterol. This is the type that deposits cholesterol in the walls of the arteries. You want this level to be low.   HDL is the good cholesterol because it cleans the arteries and carries the LDL away. You want this level to be high.  Triglycerides are fat that the body can either burn for energy or store. High levels are closely linked to heart disease.  WHAT ARE THE DESIRED LEVELS OF CHOLESTEROL?  Total cholesterol below 200.   LDL below 100 for people at risk, below 70 for those at very high risk.   HDL above 50 is good, above 60 is best.   Triglycerides below 150.  HOW CAN I LOWER MY CHOLESTEROL?  Diet. Follow your diet programs as directed by your health care provider.   Choose fish or white meat chicken and [place], roasted or baked. Limit fatty cuts  of red meat, fried foods, and processed meats, such as sausage and lunch meats.   Eat lots of fresh fruits and vegetables.  Choose whole grains, beans, pasta, potatoes, and cereals.   Use only small amounts of olive, corn, or canola oils.   Avoid butter, mayonnaise, shortening, or palm kernel oils.  Avoid foods with trans fats.   Drink skim or nonfat milk and eat low-fat or nonfat yogurt and cheeses. Avoid whole milk, cream, ice cream, egg yolks, and full-fat cheeses.   Healthy desserts include angel food cake, ginger snaps, animal crackers, hard candy, popsicles, and low-fat or nonfat frozen yogurt. Avoid pastries, cakes, pies, and cookies.   Exercise. Follow your exercise programs as directed by your health care provider.   A regular program helps decrease LDL and raise HDL.   A regular program helps with weight control.   Do things that increase your activity level like gardening, walking, or taking the stairs. Ask your health care provider about how you can be more active in your daily life.   Medicine. Take medicine only as directed by your health care provider.   Medicine may be prescribed by your health care provider to help lower cholesterol and decrease the risk for heart disease.   If you have several risk factors, you may need medicine even if your levels are normal. Document Released: 09/10/2001 Document Revised: 05/02/2014 Document Reviewed: 09/29/2013 ExitCare Patient Information 2015 [employer]. This information is not intended to replace advice given to you by your health care provider. Make sure you discuss any questions you have with your health care provider.  

## 2015-05-16 NOTE — Progress Notes (Signed)
Assessment and Plan:  1. Hypertension -Continue medication, monitor blood pressure at home. Continue DASH diet.  Reminder to go to the ER if any CP, SOB, nausea, dizziness, severe HA, changes vision/speech, left arm numbness and tingling and jaw pain.  2. Cholesterol -Continue diet and exercise. Check cholesterol.   3. Prediabetes  -Continue diet and exercise. Check A1C  4. Vitamin D Def - check level and continue medications.   Continue diet and meds as discussed. Further disposition pending results of labs. Over 30 minutes of exam, counseling, chart review, and critical decision making was performed  HPI 80 y.o. male  presents for 3 month follow up on hypertension, cholesterol, prediabetes, and vitamin D deficiency.   His blood pressure has been controlled at home, today their BP is BP: 120/70 mmHg  He does not workout. He denies chest pain, shortness of breath, dizziness.  He is on cholesterol medication, crestor 40 mg 1/2 pill dailly and denies myalgias. His cholesterol is at goal. The cholesterol last visit was:   Lab Results  Component Value Date   CHOL 118 02/14/2015   HDL 51 02/14/2015   LDLCALC 49 02/14/2015   TRIG 90 02/14/2015   CHOLHDL 2.3 02/14/2015    He has been working on diet and exercise for prediabetes, and denies paresthesia of the feet, polydipsia, polyuria and visual disturbances. Last A1C in the office was:  Lab Results  Component Value Date   HGBA1C 5.4 02/14/2015   Patient is on Vitamin D supplement.   Lab Results  Component Value Date   VD25OH 40 02/14/2015      Current Medications:  Current Outpatient Prescriptions on File Prior to Visit  Medication Sig Dispense Refill  . ALPRAZolam (XANAX) 0.5 MG tablet TAKE ONE TABLET BY MOUTH THREE TIMES DAILY AS NEEDED FOR ANXIETY 90 tablet 5  . aspirin EC 81 MG tablet Take 81 mg by mouth daily.    . bisoprolol-hydrochlorothiazide (ZIAC) 5-6.25 MG per tablet TAKE ONE TABLET BY MOUTH ONCE DAILY 90 tablet 1   . Cholecalciferol (VITAMIN D PO) Take 5,000 Units by mouth. Takes 3-4 vitamin D per week    . fenofibrate micronized (LOFIBRA) 134 MG capsule TAKE ONE CAPSULE BY MOUTH ONCE DAILY BEFORE BREAKFAST 90 capsule 0  . nitroGLYCERIN (NITROSTAT) 0.4 MG SL tablet Dissolve 1 tablet under tongue every 3 min if needed for Angina 50 tablet 99  . rosuvastatin (CRESTOR) 40 MG tablet Take 1 tablet (40 mg total) by mouth daily. for cholesterol 30 tablet 3   No current facility-administered medications on file prior to visit.   Medical History:  Past Medical History  Diagnosis Date  . Hypertension   . Hyperlipidemia   . Myocardial infarction 1986  . History of skin cancer 1995  . Bladder stone   . Enlarged prostate   . Frequency of urination   . History of kidney stones   . Cancer     SKIN   . Vitamin D deficiency    Allergies:  Allergies  Allergen Reactions  . Doxazosin     syncope  . Fish Oil     GI upset  . Flax Seeds [Flaxseed (Linseed)]     GI upset  . Lipitor [Atorvastatin]     myalgias     Review of Systems:  Review of Systems  Constitutional: Negative.   HENT: Negative.   Eyes: Negative.   Respiratory: Negative.   Cardiovascular: Negative.   Gastrointestinal: Negative.   Genitourinary: Negative.   Musculoskeletal: Negative.  Skin: Negative.   Neurological: Negative.   Endo/Heme/Allergies: Negative.   Psychiatric/Behavioral: Negative.     Family history- Review and unchanged Social history- Review and unchanged Physical Exam: BP 120/70 mmHg  Pulse 64  Temp(Src) 97.7 F (36.5 C)  Resp 16  Ht 5' 7.5" (1.715 m)  Wt 175 lb (79.379 kg)  BMI 26.99 kg/m2 Wt Readings from Last 3 Encounters:  05/16/15 175 lb (79.379 kg)  02/14/15 173 lb 9.6 oz (78.744 kg)  11/08/14 169 lb (76.658 kg)   General Appearance: Well nourished, in no apparent distress. Eyes: PERRLA, EOMs, conjunctiva no swelling or erythema Sinuses: No Frontal/maxillary tenderness ENT/Mouth: Ext aud  canals clear, TMs without erythema, bulging. No erythema, swelling, or exudate on post pharynx.  Tonsils not swollen or erythematous. Hearing normal.  Neck: Supple, thyroid normal.  Respiratory: Respiratory effort normal, BS equal bilaterally without rales, rhonchi, wheezing or stridor.  Cardio: RRR with no MRGs. Brisk peripheral pulses without edema.  Abdomen: Soft, + BS,  Non tender, no guarding, rebound, hernias, masses. Lymphatics: Non tender without lymphadenopathy.  Musculoskeletal: Full ROM, 5/5 strength, Antalgic gait Skin: Warm, dry without rashes, lesions, ecchymosis.  Neuro: Cranial nerves intact. Normal muscle tone, no cerebellar symptoms. Psych: Awake and oriented X 3, normal affect, Insight and Judgment appropriate.    Vicie Mutters, PA-C 11:14 AM [facility]

## 2015-06-30 ENCOUNTER — Encounter: Payer: Self-pay | Admitting: Internal Medicine

## 2015-07-04 ENCOUNTER — Other Ambulatory Visit: Payer: Self-pay | Admitting: Internal Medicine

## 2015-07-17 ENCOUNTER — Other Ambulatory Visit: Payer: Self-pay | Admitting: Internal Medicine

## 2015-09-15 ENCOUNTER — Encounter: Payer: Self-pay | Admitting: Internal Medicine

## 2015-09-15 ENCOUNTER — Ambulatory Visit (INDEPENDENT_AMBULATORY_CARE_PROVIDER_SITE_OTHER): Payer: Medicare Other | Admitting: Internal Medicine

## 2015-09-15 VITALS — BP 122/74 | HR 72 | Temp 97.7°F | Resp 16 | Ht 67.5 in | Wt 167.4 lb

## 2015-09-15 DIAGNOSIS — Z125 Encounter for screening for malignant neoplasm of prostate: Secondary | ICD-10-CM

## 2015-09-15 DIAGNOSIS — I10 Essential (primary) hypertension: Secondary | ICD-10-CM | POA: Diagnosis not present

## 2015-09-15 DIAGNOSIS — R7303 Prediabetes: Secondary | ICD-10-CM

## 2015-09-15 DIAGNOSIS — R7309 Other abnormal glucose: Secondary | ICD-10-CM | POA: Diagnosis not present

## 2015-09-15 DIAGNOSIS — Z9181 History of falling: Secondary | ICD-10-CM

## 2015-09-15 DIAGNOSIS — R6889 Other general symptoms and signs: Secondary | ICD-10-CM

## 2015-09-15 DIAGNOSIS — Z23 Encounter for immunization: Secondary | ICD-10-CM | POA: Diagnosis not present

## 2015-09-15 DIAGNOSIS — Z1389 Encounter for screening for other disorder: Secondary | ICD-10-CM

## 2015-09-15 DIAGNOSIS — N320 Bladder-neck obstruction: Secondary | ICD-10-CM

## 2015-09-15 DIAGNOSIS — Z1212 Encounter for screening for malignant neoplasm of rectum: Secondary | ICD-10-CM

## 2015-09-15 DIAGNOSIS — Z0001 Encounter for general adult medical examination with abnormal findings: Secondary | ICD-10-CM | POA: Diagnosis not present

## 2015-09-15 DIAGNOSIS — Z87442 Personal history of urinary calculi: Secondary | ICD-10-CM

## 2015-09-15 DIAGNOSIS — Z789 Other specified health status: Secondary | ICD-10-CM

## 2015-09-15 DIAGNOSIS — I2510 Atherosclerotic heart disease of native coronary artery without angina pectoris: Secondary | ICD-10-CM | POA: Diagnosis not present

## 2015-09-15 DIAGNOSIS — Z6826 Body mass index (BMI) 26.0-26.9, adult: Secondary | ICD-10-CM | POA: Diagnosis not present

## 2015-09-15 DIAGNOSIS — Z0000 Encounter for general adult medical examination without abnormal findings: Secondary | ICD-10-CM

## 2015-09-15 DIAGNOSIS — E785 Hyperlipidemia, unspecified: Secondary | ICD-10-CM

## 2015-09-15 DIAGNOSIS — Z1331 Encounter for screening for depression: Secondary | ICD-10-CM

## 2015-09-15 DIAGNOSIS — E559 Vitamin D deficiency, unspecified: Secondary | ICD-10-CM

## 2015-09-15 DIAGNOSIS — Z79899 Other long term (current) drug therapy: Secondary | ICD-10-CM

## 2015-09-15 LAB — LIPID PANEL
Cholesterol: 101 mg/dL — ABNORMAL LOW (ref 125–200)
HDL: 47 mg/dL (ref >=40)
LDL CALC: 37 mg/dL (ref <130)
TRIGLYCERIDES: 85 mg/dL (ref <150)
Total CHOL/HDL Ratio: 2.1 Ratio (ref <=5.0)
VLDL: 17 mg/dL (ref <30)

## 2015-09-15 LAB — TSH: TSH: 2.107 u[IU]/mL (ref 0.350–4.500)

## 2015-09-15 LAB — CBC WITH DIFFERENTIAL/PLATELET
BASOS ABS: 0.1 10*3/uL (ref 0.0–0.1)
Basophils Relative: 1 % (ref 0–1)
Eosinophils Absolute: 0.2 10*3/uL (ref 0.0–0.7)
Eosinophils Relative: 2 % (ref 0–5)
HEMATOCRIT: 43.1 % (ref 39.0–52.0)
HEMOGLOBIN: 14.4 g/dL (ref 13.0–17.0)
LYMPHS ABS: 2.7 10*3/uL (ref 0.7–4.0)
LYMPHS PCT: 36 % (ref 12–46)
MCH: 33.9 pg (ref 26.0–34.0)
MCHC: 33.4 g/dL (ref 30.0–36.0)
MCV: 101.4 fL — AB (ref 78.0–100.0)
MPV: 12.9 fL — AB (ref 8.6–12.4)
Monocytes Absolute: 1.1 10*3/uL — ABNORMAL HIGH (ref 0.1–1.0)
Monocytes Relative: 14 % — ABNORMAL HIGH (ref 3–12)
NEUTROS ABS: 3.5 10*3/uL (ref 1.7–7.7)
NEUTROS PCT: 47 % (ref 43–77)
Platelets: 190 10*3/uL (ref 150–400)
RBC: 4.25 MIL/uL (ref 4.22–5.81)
RDW: 14.4 % (ref 11.5–15.5)
WBC: 7.5 10*3/uL (ref 4.0–10.5)

## 2015-09-15 LAB — HEPATIC FUNCTION PANEL
ALBUMIN: 3.8 g/dL (ref 3.6–5.1)
ALK PHOS: 42 U/L (ref 40–115)
ALT: 19 U/L (ref 9–46)
AST: 40 U/L — ABNORMAL HIGH (ref 10–35)
BILIRUBIN INDIRECT: 1.2 mg/dL (ref 0.2–1.2)
Bilirubin, Direct: 0.7 mg/dL — ABNORMAL HIGH (ref <=0.2)
TOTAL PROTEIN: 6.3 g/dL (ref 6.1–8.1)
Total Bilirubin: 1.9 mg/dL — ABNORMAL HIGH (ref 0.2–1.2)

## 2015-09-15 LAB — BASIC METABOLIC PANEL WITH GFR
BUN: 14 mg/dL (ref 7–25)
CO2: 28 mmol/L (ref 20–31)
Calcium: 9.2 mg/dL (ref 8.6–10.3)
Chloride: 106 mmol/L (ref 98–110)
Creat: 1.02 mg/dL (ref 0.70–1.18)
GFR, Est African American: 82 mL/min (ref >=60)
GFR, Est Non African American: 71 mL/min (ref >=60)
Glucose, Bld: 84 mg/dL (ref 65–99)
Potassium: 3.7 mmol/L (ref 3.5–5.3)
Sodium: 141 mmol/L (ref 135–146)

## 2015-09-15 LAB — MAGNESIUM: Magnesium: 1.8 mg/dL (ref 1.5–2.5)

## 2015-09-15 NOTE — Progress Notes (Signed)
Patient ID: Bobby Johnson, male   DOB: 05/26/1935, 80 y.o.   MRN: [id number]  MEDICARE ANNUAL WELLNESS VISIT  & Comrehensive Examination  Assessment:   1. Essential hypertension  - EKG 12-Lead - [place], RETROPERITNL ABD,  LTD - TSH  2. Hyperlipidemia  - Microalbumin / creatinine urine ratio - Lipid panel  3. Prediabetes  - Hemoglobin A1c - Insulin, random  4. Vitamin D deficiency  - Vit D  25 hydroxy (rtn osteoporosis monitoring)  5. Medicare annual wellness visit, subsequent   6. ASHD s/p PTCA (1986)   7. History of kidney stones   8. Screening for rectal cancer  - POC Hemoccult Bld/Stl (3-Cd Home Screen); Future  9. Prostate cancer screening  - PSA  10. Depression screen   11. At low risk for fall   12. BMI 26.0-26.9,adult   13. Medication management  - Urinalysis, Routine w reflex microscopic (not at [facility]) - CBC with Differential/Platelet - BASIC METABOLIC PANEL WITH GFR - Hepatic function panel - Magnesium  14. Encounter for general adult medical examination with abnormal findings   15. Bladder neck obstruction  - PSA  16. Need for prophylactic vaccination and inoculation against influenza  - Flu vaccine HIGH DOSE PF (Fluzone High dose)  Plan:   During the course of the visit the patient was educated and counseled about appropriate screening and preventive services including:    Pneumococcal vaccine   Influenza vaccine  Td vaccine  Screening electrocardiogram  Bone densitometry screening  Colorectal cancer screening  Diabetes screening  Glaucoma screening  Nutrition counseling   Advanced directives: requested  Screening recommendations, referrals: Vaccinations: Immunization History  Administered Date(s) Administered  . Influenza Split 10/06/2012, 10/05/2013, 12/13/2014  . Influenza, High Dose Seasonal PF 09/15/2015  . Pneumococcal Polysaccharide-23 12/30/2004  . Td 12/30/2001, 12/30/2008  . Zoster 12/30/2010   Prevnar vaccine deferred Hep B vaccine not indicated  Nutrition assessed and recommended  Colonoscopy 1989 & rrfuses further colonoscopy Recommended yearly ophthalmology/optometry visit for glaucoma screening and checkup Recommended yearly dental visit for hygiene and checkup Advanced directives - yes  Conditions/risks identified: BMI: Discussed weight loss, diet, and increase physical activity.  Increase physical activity: AHA recommends 150 minutes of physical activity a week.  Medications reviewed PreDiabetes is at goal, ACE/ARB therapy: Not Indicated Urinary Incontinence is not an issue: discussed non pharmacology and pharmacology options.  Fall risk: low- discussed PT, home fall assessment, medications.   Subjective:    Bobby Johnson  presents for [facility] Annual Wellness Visit and comprehensive evaluation & Physical Examination.  Date of last medicare wellness visit was .  This very nice 80 y.o. MWM presents for 3 month follow up with Hypertension, Hyperlipidemia, Pre-Diabetes and Vitamin D Deficiency.    Patient is treated for HTN since 1998 & BP has been controlled at home. Today's BP: 122/74 mmHg. Patient had an acute Inferior MI in 1986 and underwent PTCA. Patient has done well since then w/o any complaints of any cardiac type chest pain, palpitations, dyspnea/orthopnea/PND, dizziness, claudication, or dependent edema.   Hyperlipidemia is controlled with diet & meds. Patient denies myalgias or other med SE's. Last Lipids were at goal - Cholesterol 113; HDL 56; LDL 41; Triglycerides 79 on 05/16/2015.   Also, the patient has history of PreDiabetes since 2011 with A1c 5.7% and has had no symptoms of reactive hypoglycemia, diabetic polys, paresthesias or visual blurring.  Last A1c was  5.6% on 05/16/2015.   Further, the patient also has history of Vitamin  D Deficiency of 28 in 2008  and supplements vitamin D without any suspected side-effects. Last vitamin D was 40 on  02/14/2015.  Names of Other Physician/Practitioners you currently use: 1. [facility] here for primary care 2. Dr Charise Killian, eye doctor, for Cat surg in 2000 & declines f/u 3. Dr Donn Pierini, [place], dentist, last visit years ago & declines f/u  Patient Care Team: Unk Pinto, MD as PCP - General (Internal Medicine) Irine Seal, MD as Attending Physician (Urology) Bryson Dames, MD (Cardiology) Laurence Spates, MD as Consulting Physician (Gastroenterology)  Medication Review: Medication Sig  . ALPRAZolam  0.5 MG tablet TAKE ONE TABLET BY MOUTH THREE TIMES DAILY AS NEEDED FOR ANXIETY  . aspirin EC 81 MG  Take 81 mg by mouth daily.  . bisoprolol-hctz (ZIAC) 5-6.25  TAKE ONE TABLET BY MOUTH ONCE DAILY  . VITAMIN D Take 5,000 Units by mouth. Takes 3-4 vitamin D per week  . CRESTOR 40 MG  TAKE ONE TABLET BY MOUTH ONCE DAILY FOR CHOLESTEROL  . fenofibrate  134 MG  TAKE ONE CAPSULE BY MOUTH ONCE DAILY BEFORE  BREAKFAST  . NITROSTAT 0.4 MG SL Dissolve 1 tablet under tongue every 3 min if needed for Angina   Allergies  Allergen Reactions  . Doxazosin     syncope  . Fish Oil     GI upset  . Flax Seeds [Flaxseed (Linseed)]     GI upset  . Lipitor [Atorvastatin]     myalgias   Current Problems (verified) Patient Active Problem List   Diagnosis Date Noted  . ASHD 02/14/2015  . Vitamin D deficiency 04/15/2014  . Prediabetes 04/15/2014  . Medication management 04/15/2014  . Hyperlipidemia   . History of kidney stones   . Skin cancer   . Hypertension    Screening Tests Health Maintenance  Topic Date Due  . COLONOSCOPY  09/27/1987  . PNA vac Low Risk Adult (2 of 2 - PCV13) 12/30/2005  . INFLUENZA VACCINE  07/30/2016  . TETANUS/TDAP  12/30/2018  . ZOSTAVAX  Completed   Immunization History  Administered Date(s) Administered  . Influenza Split 10/06/2012, 10/05/2013, 12/13/2014  . Influenza, High Dose Seasonal PF 09/15/2015  . Pneumococcal  Polysaccharide-23 12/30/2004  . Td 12/30/2001, 12/30/2008  . Zoster 12/30/2010   Preventative care: Last colonoscopy: 2009 & declines f/u Colonoscopy  Past Medical History  Diagnosis Date  . Hypertension   . Hyperlipidemia   . History of kidney stones   . Vitamin D deficiency    Past Surgical History  Procedure Laterality Date  . Lithotripsy    . Cataracts      REMOVED  . Skin cancer excision    . Cystoscopy with litholapaxy N/A 03/11/2013    Procedure: CYSTOSCOPY WITH LITHOLAPAXY;  Surgeon: Malka So, MD;  Location: [facility];  Service: Urology;  Laterality: N/A;  90 mins requested for this case    . Holmium laser application N/A 04/25/622    Procedure: HOLMIUM LASER APPLICATION;  Surgeon: Malka So, MD;  Location: [facility];  Service: Urology;  Laterality: N/A;  . Transurethral resection of prostate N/A 03/11/2013    Procedure: TRANSURETHRAL RESECTION OF THE PROSTATE WITH GYRUS INSTRUMENTS;  Surgeon: Malka So, MD;  Location: [facility];  Service: Urology;  Laterality: N/A;   Risk Factors: Tobacco Social History  Substance Use Topics  . Smoking status: Current Every Day Smoker -- 0.75 packs/day  . Smokeless tobacco: None  . Alcohol Use: 4.0  oz/week    8 drink(s) per week     Comment: OCCASIONAL   He does smoke & declines to consider smoking cessation. Are there smokers in your home (other than you)?  No  Alcohol Current alcohol use: history unreliable, Per wife patient gets "hammered" every day, but patient disavows this.   Caffeine Current caffeine use: 3 cups coffee/week  Exercise Current exercise: yard work  Nutrition/Diet Current diet: in general, a "healthy" diet    Cardiac risk factors: advanced age (older than 37 for men, 79 for women), dyslipidemia, hypertension, male gender, sedentary lifestyle and smoking/ tobacco exposure.  Depression Screen (Note: if answer to either of the following is "Yes", a more complete depression screening is indicated)    Q1: Over the past two weeks, have you felt down, depressed or hopeless? No  Q2: Over the past two weeks, have you felt little interest or pleasure in doing things? No  Have you lost interest or pleasure in daily life? No  Do you often feel hopeless? No  Do you cry easily over simple problems? No  Activities of Daily Living In your present state of health, do you have any difficulty performing the following activities?:  Driving? No Managing money?  No Feeding yourself? No Getting from bed to chair? No Climbing a flight of stairs? No Preparing food and eating?: No Bathing or showering? No Getting dressed: No Getting to the toilet? No Using the toilet:No Moving around from place to place: No In the past year have you fallen or had a near fall?:No   Are you sexually active?  Yes  Do you have more than one partner?  No  Vision Difficulties: No  Hearing Difficulties: No Do you often ask people to speak up or repeat themselves? No Do you experience ringing or noises in your ears? No Do you have difficulty understanding soft or whispered voices? No  Cognition  Do you feel that you have a problem with memory?No  Do you often misplace items? No  Do you feel safe at home?  Yes  Advanced directives Does patient have a [place]? Yes Does patient have a Living Will? Yes  ROS: Constitutional: Denies fever, chills, weight loss/gain, headaches, insomnia, fatigue, night sweats or change in appetite. Eyes: Denies redness, blurred vision, diplopia, discharge, itchy or watery eyes.  ENT: Denies discharge, congestion, post nasal drip, epistaxis, sore throat, earache, hearing loss, dental pain, Tinnitus, Vertigo, Sinus pain or snoring.  Cardio: Denies chest pain, palpitations, irregular heartbeat, syncope, dyspnea, diaphoresis, orthopnea, PND, claudication or edema Respiratory: denies cough, dyspnea, DOE, pleurisy, hoarseness, laryngitis or wheezing.   Gastrointestinal: Denies dysphagia, heartburn, reflux, water brash, pain, cramps, nausea, vomiting, bloating, diarrhea, constipation, hematemesis, melena, hematochezia, jaundice or hemorrhoids Genitourinary: Denies dysuria, frequency, urgency, nocturia, hesitancy, discharge, hematuria or flank pain Musculoskeletal: Denies arthralgia, myalgia, stiffness, Jt. Swelling, pain, limp or strain/sprain. Denies Falls. Skin: Denies puritis, rash, hives, warts, acne, eczema or change in skin lesion Neuro: No weakness, tremor, incoordination, spasms, paresthesia or pain Psychiatric: Denies confusion, memory loss or sensory loss. Denies Depression. Endocrine: Denies change in weight, skin, hair change, nocturia, and paresthesia, diabetic polys, visual blurring or hyper / hypo glycemic episodes.  Heme/Lymph: No excessive bleeding, bruising or enlarged lymph nodes.  Objective:     BP 122/74 mmHg  Pulse 72  Temp(Src) 97.7 F (36.5 C)  Resp 16  Ht 5' 7.5" (1.715 m)  Wt 167 lb 6.4 oz (75.932 kg)  BMI 25.82 kg/m2  General Appearance:  Alert  WD/WN, male  in no apparent distress. Eyes: PERRLA, EOMs nl, conjunctiva normal, normal fundi and vessels. Sinuses: No frontal/maxillary tenderness ENT/Mouth: EACs patent / TMs  nl. Nares clear without erythema, swelling, mucoid exudates. Oral hygiene is good. No erythema, swelling, or exudate. Tongue normal, non-obstructing. Tonsils not swollen or erythematous. Hearing normal.  Neck: Supple, thyroid normal. No bruits, nodes or JVD. Respiratory: Respiratory effort normal.  BS equal and clear bilateral without rales, rhonci, wheezing or stridor. Cardio: Heart sounds are normal with regular rate and rhythm and no murmurs, rubs or gallops. Peripheral pulses are normal and equal bilaterally without edema. No aortic or femoral bruits. Chest: symmetric with normal excursions and percussion.  Abdomen: Flat, soft, with nl bowel sounds. Nontender, no guarding, rebound,  hernias, masses, or organomegaly.  Lymphatics: Non tender without lymphadenopathy.  Genitourinary: No hernias.Testes nl. DRE - prostate nl for age - smooth & firm w/o nodules. Musculoskeletal: Full ROM all peripheral extremities, joint stability, 5/5 strength, and normal gait. Skin: Warm and dry without rashes, lesions, cyanosis, clubbing or  ecchymosis.  Neuro: Cranial nerves intact, reflexes equal bilaterally. Normal muscle tone, no cerebellar symptoms. Sensation intact.  Pysch: Alert and oriented X 3 with normal affect, insight and judgment appropriate.   Cognitive Testing  Alert? Yes  Normal Appearance? Yes  Oriented to person? Yes  Place? Yes   Time? Yes  Recall of three objects?  Yes  Can perform simple calculations? Yes  Displays appropriate judgment? Yes  Can read the correct time from a watch/clock? Yes  Medicare Attestation I have personally reviewed: The patient's medical and social history Their use of alcohol, tobacco or illicit drugs Their current medications and supplements The patient's functional ability including ADLs,fall risks, home safety risks, cognitive, and hearing and visual impairment Diet and physical activities Evidence for depression or mood disorders  The patient's weight, height, BMI, and visual acuity have been recorded in the chart.  I have made referrals, counseling, and provided education to the patient based on review of the above and I have provided the patient with a written personalized care plan for preventive services.  Over 40 minutes of exam, counseling, chart review was performed.  MCKEOWN,WILLIAM DAVID, MD   09/17/2015

## 2015-09-15 NOTE — Patient Instructions (Signed)

## 2015-09-16 LAB — PSA: PSA: 1.27 ng/mL (ref <=4.00)

## 2015-09-16 LAB — URINALYSIS, ROUTINE W REFLEX MICROSCOPIC
Bilirubin Urine: NEGATIVE
Glucose, UA: NEGATIVE
Ketones, ur: NEGATIVE
NITRITE: NEGATIVE
SPECIFIC GRAVITY, URINE: 1.018 (ref 1.001–1.035)
pH: 7 (ref 5.0–8.0)

## 2015-09-16 LAB — URINALYSIS, MICROSCOPIC ONLY
BACTERIA UA: NONE SEEN [HPF]
CRYSTALS: NONE SEEN [HPF]
Casts: NONE SEEN [LPF]
Squamous Epithelial / LPF: NONE SEEN [HPF] (ref <=5)
Yeast: NONE SEEN [HPF]

## 2015-09-16 LAB — INSULIN, RANDOM: INSULIN: 3.3 u[IU]/mL (ref 2.0–19.6)

## 2015-09-16 LAB — MICROALBUMIN / CREATININE URINE RATIO
Creatinine, Urine: 173.1 mg/dL
Microalb Creat Ratio: 22.5 mg/g (ref 0.0–30.0)
Microalb, Ur: 3.9 mg/dL — ABNORMAL HIGH (ref <2.0)

## 2015-09-16 LAB — HEMOGLOBIN A1C
HEMOGLOBIN A1C: 5.6 % (ref <5.7)
MEAN PLASMA GLUCOSE: 114 mg/dL (ref <117)

## 2015-09-16 LAB — VITAMIN D 25 HYDROXY (VIT D DEFICIENCY, FRACTURES): Vit D, 25-Hydroxy: 49 ng/mL (ref 30–100)

## 2015-09-17 ENCOUNTER — Encounter: Payer: Self-pay | Admitting: Internal Medicine

## 2015-09-18 ENCOUNTER — Other Ambulatory Visit: Payer: Self-pay | Admitting: Internal Medicine

## 2015-09-18 ENCOUNTER — Other Ambulatory Visit: Payer: Self-pay | Admitting: *Deleted

## 2015-09-18 MED ORDER — ROSUVASTATIN CALCIUM 40 MG PO TABS: ORAL_TABLET | ORAL | Status: AC

## 2015-09-29 ENCOUNTER — Encounter: Payer: Self-pay | Admitting: Internal Medicine

## 2015-09-29 ENCOUNTER — Ambulatory Visit (INDEPENDENT_AMBULATORY_CARE_PROVIDER_SITE_OTHER): Payer: Medicare Other | Admitting: Internal Medicine

## 2015-09-29 VITALS — BP 112/56 | HR 80 | Temp 97.8°F | Resp 16 | Ht 67.5 in | Wt 174.0 lb

## 2015-09-29 DIAGNOSIS — I4891 Unspecified atrial fibrillation: Secondary | ICD-10-CM | POA: Diagnosis not present

## 2015-09-29 DIAGNOSIS — I2510 Atherosclerotic heart disease of native coronary artery without angina pectoris: Secondary | ICD-10-CM | POA: Diagnosis not present

## 2015-09-29 MED ORDER — BISOPROLOL FUMARATE 5 MG PO TABS: ORAL_TABLET | ORAL | Status: AC

## 2015-09-29 NOTE — Patient Instructions (Signed)
Please stop taking the bisoprolol hydrochlorothiazide medication.  We are going to change you to just bisoprolol 1/2 tablet daily.  Please keep an eye on your blood pressure at home.  If it is consistently 110/60 or less please let us know.  Please stop using salt for the time being.  Elevate your feet above your heart as often as possible.  Please start using compression stockings.  You can buy either Dr. Karie Georges or Konrad Dolores Copper at [place].

## 2015-09-29 NOTE — Progress Notes (Signed)
Patient ID: Bobby Johnson, male   DOB: [birth year], 80 y.o.   MRN: [id number]  Assessment and Plan:   1. Atrial fibrillation, unspecified -cardizem failed.   -BP low with some peripheral edema -stop ziac -start bisoprolol 2.5 mg daily -compression stockings -elevate feet -avoid salt - Ambulatory referral to Cardiology -did not start on blood thinners due to likely impending procedure to be done today vs this weekend at urology.  Will see next week to see if increase in BP helping.  Patient not currently a candidate for lasix as his BP is so low.   -Can talk about starting blood thinner at next visit. -cont asa for time being.     HPI 80 y.o.male presents for new onset of atrial fibrillation 2 weeks ago.  Patient reports that he has been very tired and weak and on the whole has not been feeling well.  His wife reports that he needs assistance getting up and has had a lot of swelling going on.  They do not regularly check BP at home.  He did take 2 weeks of cardizem and reports no adverse reactions, but felt no better on the medication.  He did not lower the dose of ziac.   He has had increased swelling in his ankle, but he also has been taking a lot of salt in in his diet.  He has not seen cardiology in a very long time.  He reports that he used to see Dr. Simeon Craft.  He denies alcohol use and caffeine use.    Patient has also passed several kidney stones in the past week.  He is going to see urology this afternoon at 1 pm. He may likely have lithotripsy on these stones per his wife.    Patient denies CP, SOB, visual changes, PND, orthopnea, or numbness.  He admits to weakness, fatigue, peripheral edema.  Past Medical History  Diagnosis Date  . Hypertension   . Hyperlipidemia   . History of kidney stones   . Vitamin D deficiency      Allergies  Allergen Reactions  . Doxazosin     syncope  . Fish Oil     GI upset  . Flax Seeds [Flaxseed (Linseed)]     GI upset  . Lipitor  [Atorvastatin]     myalgias      Current Outpatient Prescriptions on File Prior to Visit  Medication Sig Dispense Refill  . ALPRAZolam (XANAX) 0.5 MG tablet TAKE ONE TABLET BY MOUTH THREE TIMES DAILY AS NEEDED FOR ANXIETY 90 tablet 5  . aspirin EC 81 MG tablet Take 81 mg by mouth daily.    . bisoprolol-hydrochlorothiazide (ZIAC) 5-6.25 MG per tablet TAKE ONE TABLET BY MOUTH ONCE DAILY 90 tablet 1  . Cholecalciferol (VITAMIN D PO) Take 5,000 Units by mouth. Takes 3-4 vitamin D per week    . fenofibrate micronized (LOFIBRA) 134 MG capsule TAKE ONE CAPSULE BY MOUTH ONCE DAILY BEFORE  BREAKFAST 90 capsule 3  . nitroGLYCERIN (NITROSTAT) 0.4 MG SL tablet Dissolve 1 tablet under tongue every 3 min if needed for Angina 50 tablet 99  . rosuvastatin (CRESTOR) 40 MG tablet TAKE ONE TABLET BY MOUTH ONCE DAILY FOR CHOLESTEROL 30 tablet 6   No current facility-administered medications on file prior to visit.    ROS: all negative except above.   Physical Exam: Filed Weights   09/29/15 1000  Weight: 174 lb (78.926 kg)   BP 112/56 mmHg  Pulse 80  Temp(Src) 97.8 F (36.6  C) (Temporal)  Resp 16  Ht 5' 7.5" (1.715 m)  Wt 174 lb (78.926 kg)  BMI 26.83 kg/m2  SpO2 94% General Appearance: Well developed well nourished, non-toxic appearing in no apparent distress. Eyes: PERRLA, EOMs, conjunctiva w/ no swelling or erythema or discharge Sinuses: No Frontal/maxillary tenderness ENT/Mouth: Ear canals clear without swelling or erythema.  TM's normal bilaterally with no retractions, bulging, or loss of landmarks.   Neck: Supple, thyroid normal, no notable JVD  Respiratory: Respiratory effort normal, Clear breath sounds anteriorly and posteriorly bilaterally without rales, rhonchi, wheezing or stridor. No retractions or accessory muscle usage. Cardio: Ireg ireg with no MRGs.  2+ pitting peripheral pretibial edema Abdomen: Soft, + BS.  Non tender, no guarding, rebound, hernias, masses.  Musculoskeletal:  Full ROM, 5/5 strength, normal gait.  Skin: Warm, dry without rashes  Neuro: Awake and oriented X 3, Cranial nerves intact. Normal muscle tone, no cerebellar symptoms. Sensation intact.  Psych: normal affect, Insight and Judgment appropriate.     Starlyn Skeans, PA-C 10:28 AM [facility]

## 2015-10-02 ENCOUNTER — Encounter: Payer: Self-pay | Admitting: Cardiology

## 2015-10-02 ENCOUNTER — Telehealth: Payer: Self-pay | Admitting: *Deleted

## 2015-10-02 ENCOUNTER — Telehealth: Payer: Self-pay | Admitting: Cardiology

## 2015-10-02 ENCOUNTER — Ambulatory Visit (INDEPENDENT_AMBULATORY_CARE_PROVIDER_SITE_OTHER): Payer: Medicare Other | Admitting: Cardiology

## 2015-10-02 VITALS — BP 114/60 | HR 75 | Ht 68.0 in | Wt 174.8 lb

## 2015-10-02 DIAGNOSIS — I4891 Unspecified atrial fibrillation: Secondary | ICD-10-CM

## 2015-10-02 DIAGNOSIS — I5031 Acute diastolic (congestive) heart failure: Secondary | ICD-10-CM

## 2015-10-02 DIAGNOSIS — R0989 Other specified symptoms and signs involving the circulatory and respiratory systems: Secondary | ICD-10-CM | POA: Insufficient documentation

## 2015-10-02 DIAGNOSIS — I2510 Atherosclerotic heart disease of native coronary artery without angina pectoris: Secondary | ICD-10-CM

## 2015-10-02 DIAGNOSIS — I10 Essential (primary) hypertension: Secondary | ICD-10-CM

## 2015-10-02 MED ORDER — RIVAROXABAN 20 MG PO TABS: 20.0000 mg | ORAL_TABLET | Freq: Every day | ORAL | Status: AC

## 2015-10-02 MED ORDER — FUROSEMIDE 20 MG PO TABS: 20.0000 mg | ORAL_TABLET | Freq: Every day | ORAL | Status: AC

## 2015-10-02 NOTE — Progress Notes (Signed)
Cardiology Office Note   Date:  10/02/2015   ID:  DAMARKUS BALIS, DOB 07/06/1935, MRN [id number]  PCP:  Alesia Richards, MD  Cardiologist:   Candee Furbish, MD       History of Present Illness: Bobby Johnson is a 80 y.o. male here for the evaluation of atrial fibrillation. New-onset atrial fibrillation noted in mid September. Has been feeling quite weak, tired when reviewing past office note from 09/29/15. Increased edema. Felt no better with Cardizem initiation. She previously saw Dr. Melvern Banker in the past. Has not seen a cardiologist in quite some time. Blood pressure has been fairly low.  Has had lithotripsy.  3 months ago started getting weak. Thought he was getting old. Walking out to mail box, not out of breath, but legs are getting really tired. Now they are swelling.   Jan 08 1985 had MI. Doing well from this.   Sometimes jumps in his sleep.   Had vertigo in 2/16. Feels unsteady when gets up still.     Past Medical History  Diagnosis Date  . Hypertension   . Hyperlipidemia   . History of kidney stones   . Vitamin D deficiency     Past Surgical History  Procedure Laterality Date  . Lithotripsy    . Cataracts      REMOVED  . Skin cancer excision    . Cystoscopy with litholapaxy N/A 03/11/2013    Procedure: CYSTOSCOPY WITH LITHOLAPAXY;  Surgeon: Malka So, MD;  Location: [facility];  Service: Urology;  Laterality: N/A;  90 mins requested for this case    . Holmium laser application N/A 9/82/6415    Procedure: HOLMIUM LASER APPLICATION;  Surgeon: Malka So, MD;  Location: [facility];  Service: Urology;  Laterality: N/A;  . Transurethral resection of prostate N/A 03/11/2013    Procedure: TRANSURETHRAL RESECTION OF THE PROSTATE WITH GYRUS INSTRUMENTS;  Surgeon: Malka So, MD;  Location: [facility];  Service: Urology;  Laterality: N/A;     Current Outpatient Prescriptions  Medication Sig Dispense Refill  . ALPRAZolam (XANAX) 0.5 MG tablet TAKE ONE TABLET BY MOUTH  THREE TIMES DAILY AS NEEDED FOR ANXIETY 90 tablet 5  . bisoprolol (ZEBETA) 5 MG tablet Take 1/2 tablet daily for blood pressure and heart rate regulation 30 tablet 0  . Cholecalciferol (VITAMIN D PO) Take 5,000 Units by mouth. Takes 3-4 vitamin D per week    . fenofibrate micronized (LOFIBRA) 134 MG capsule TAKE ONE CAPSULE BY MOUTH ONCE DAILY BEFORE  BREAKFAST 90 capsule 3  . nitroGLYCERIN (NITROSTAT) 0.4 MG SL tablet Dissolve 1 tablet under tongue every 3 min if needed for Angina 50 tablet 99  . rosuvastatin (CRESTOR) 40 MG tablet TAKE ONE TABLET BY MOUTH ONCE DAILY FOR CHOLESTEROL 30 tablet 6  . furosemide (LASIX) 20 MG tablet Take 1 tablet (20 mg total) by mouth daily. 30 tablet 11  . rivaroxaban (XARELTO) 20 MG TABS tablet Take 1 tablet (20 mg total) by mouth daily with supper. 30 tablet 11   No current facility-administered medications for this visit.    Allergies:   Doxazosin; Fish oil; Flax seeds; and Lipitor    Social History:  The patient  reports that he has been smoking.  He does not have any smokeless tobacco history on file. He reports that he drinks about 4.0 oz of alcohol per week. 3/4  Pack.   Family History:  The patient's family history includes Diabetes in his mother and sister;  Heart attack in his sister; Heart disease in his brother, father, mother, sister, and sister; Hypertension in his father.    ROS:  Please see the history of present illness.   Otherwise, review of systems are positive for weakness, fatigue, lower extremity swelling. Denies chest pain.   All other systems are reviewed and negative.    PHYSICAL EXAM: VS:  BP 114/60 mmHg  Pulse 75  Ht 5\' 8"  (1.727 m)  Wt 174 lb 12.8 oz (79.289 kg)  BMI 26.58 kg/m2  SpO2 91% , BMI Body mass index is 26.58 kg/(m^2). GEN: Well nourished, well developed, in no acute distress HEENT: normal Neck: no JVD, +right carotid bruit, or masses Cardiac: Irreg irreg, no tachycardia; no murmurs, rubs, or gallops, 2+ BLE edema   Respiratory:  Mildly reduced air movement bilaterally, normal work of breathing GI: soft, nontender, nondistended, + BS MS: no deformity or atrophy Skin: warm and dry, no rash, difficult to palpate distal pulses Neuro:  Strength and sensation are intact Psych: euthymic mood, full affect   EKG:  Today 10/02/15-age of relation heart rate 109 bpm, vertical axis personally viewed-prior EKG on 09/15/15 reported as atrial fibrillation.   Recent Labs: 09/15/2015: ALT 19; BUN 14; Creat 1.02; Hemoglobin 14.4; Magnesium 1.8; Platelets 190; Potassium 3.7; Sodium 141; TSH 2.107    Lipid Panel    Component Value Date/Time   CHOL 101* 09/15/2015 1203   TRIG 85 09/15/2015 1203   HDL 47 09/15/2015 1203   CHOLHDL 2.1 09/15/2015 1203   VLDL 17 09/15/2015 1203   LDLCALC 37 09/15/2015 1203      Wt Readings from Last 3 Encounters:  10/02/15 174 lb 12.8 oz (79.289 kg)  09/29/15 174 lb (78.926 kg)  09/15/15 167 lb 6.4 oz (75.932 kg)      Other studies Reviewed: Additional studies/ records that were reviewed today include: Prior office notes, labs, EKG Review of the above records demonstrates: As above   ASSESSMENT AND PLAN:  New onset atrial fibrillation  - We will start anticoagulation, Xarelto 20 mg once a day. Discussed risk of bleeding. Understands importance given risk of stroke. CHADS-Vasc - 3 (Age 89, HTN).  - We will check echo.  - Recent TSH is normal  - No signs of infection  - After 3 weeks of anticoagulation, we will plan cardioversion. Risks and benefits of procedure discussed. We will see him post cardioversion.  - We will continue with low-dose bisoprolol for rate control. Slightly fast.  - Stop aspirin since starting Xarelto.  Acute diastolic heart failure  - Checking echo to ensure whether this is diastolic or systolic  - We will give Lasix 20 mg once a day to help with lower extremity edema.  - Compression hose.  - Continue low-dose beta blocker. Agree, not much room  with blood pressure, we will hold off on starting ACE inhibitor or angiotensin receptor blocker.  - If ejection fraction is reduced, this may be secondary to atrial fibrillation/tachycardia-induced cardiomyopathy perhaps. Hopefully cardioversion will help with this.  Carotid bruit  - We will check carotid Dopplers, right-sided bruit appreciated.  Tobacco use  - Encourage cessation. He states that he will try to cut back.  Nephrolithiasis  - May need further instrumentation. He discussed with his urologist who would like him to first get his atrial fibrillation in order. This makes sense. Watch for any signs of hematuria,he has had this in the past.  Current medicines are reviewed at length with the patient today.  The  patient does not have concerns regarding medicines.  The following changes have been made:  lasix  Labs/ tests ordered today include:   Orders Placed This Encounter  Procedures  . EKG 12-Lead  . Echocardiogram     Disposition:   FU with Nekita Pita post cardioversion   Signed, Candee Furbish, MD  10/02/2015 3:19 PM    [facility] [address], [place], [place]  [address] Phone: [phone]; Fax: [phone]

## 2015-10-02 NOTE — Patient Instructions (Addendum)
Medication Instructions:  Please stop your ASA. Please start Xarelto 20 mg a day. Start Furosemide 20 mg a day. Continue all other medications as listed.  Testing/Procedures: Your physician has requested that you have a carotid duplex. This test is an ultrasound of the carotid arteries in your neck. It looks at blood flow through these arteries that supply the brain with blood. Allow one hour for this exam. There are no restrictions or special instructions.  Your physician has requested that you have an echocardiogram. Echocardiography is a painless test that uses sound waves to create images of your heart. It provides your doctor with information about the size and shape of your heart and how well your heart's chambers and valves are working. This procedure takes approximately one hour. There are no restrictions for this procedure.  Your physician has requested that you have a Cardioversion after being on Xarelto for a total of 3 weeks (around October 25th).  Electrical Cardioversion uses a jolt of electricity to your heart either through paddles or wired patches attached to your chest. This is a controlled, usually prescheduled, procedure. This procedure is done at the hospital and you are not awake during the procedure. You usually go home the day of the procedure. Please see the instruction sheet given to you today for more information.  Follow-Up: Follow up with Dr Marlou Porch approximately 2 weeks after your cardioversion.  Thank you for choosing [facility]!!

## 2015-10-02 NOTE — Telephone Encounter (Signed)
Dr Kandis Mannan office called and requested we give patient some samples of Xarelto, since they were out.  Per the patient's OV note, he had an RX sent to his pharmacy for the 20 mg Xarelto  Patient was give Xarelto 20 mg samples # 10.  OK per Vicie Mutters, PA.

## 2015-10-02 NOTE — Telephone Encounter (Signed)
Spoke with pt wife, for about 2 weeks now the pt has been feeling bad. He was seen by dr Maurice Small office on Friday and his ziac was stopped and he was started on bisoprolol. His bp this am is 90/57. She reports he is out of rhythm and feels so weak he can not walk. He has a follow up appt with dr hochrein on 10-26 but he feels he needs to be seen sooner. He also reports having 4 kidney stones in his bladder, he has no pain but they will not work on the kidney stones until his heart and bp are taken care of. Pt will see dr Marlou Porch today at 2;30p. Patient voiced understanding of appt time and location of the office.

## 2015-10-02 NOTE — Telephone Encounter (Signed)
Pt's wife called in concerned about her husband's health. She says that over the past week his has been very weak to the point to where his can barely walk, his BP has been dropping, and he has been nauseous. She doesn't feel that he can wait until 10/26 to be seen. Please f/u with her  Thanks

## 2015-10-06 ENCOUNTER — Other Ambulatory Visit: Payer: Self-pay | Admitting: Internal Medicine

## 2015-10-07 NOTE — Addendum Note (Signed)
Addended by: Alizea Pell A on: 10/07/2015 10:44 AM   Modules accepted: Orders

## 2015-10-10 ENCOUNTER — Telehealth: Payer: Self-pay | Admitting: Cardiology

## 2015-10-10 ENCOUNTER — Other Ambulatory Visit: Payer: Self-pay | Admitting: Cardiology

## 2015-10-10 DIAGNOSIS — R0989 Other specified symptoms and signs involving the circulatory and respiratory systems: Secondary | ICD-10-CM

## 2015-10-10 NOTE — Telephone Encounter (Signed)
[name] sudden death was relayed. Spoke to his wife on phone and expressed my condolences.   Candee Furbish, MD

## 2015-10-11 ENCOUNTER — Other Ambulatory Visit (HOSPITAL_COMMUNITY): Payer: Medicare Other

## 2015-10-11 ENCOUNTER — Inpatient Hospital Stay (HOSPITAL_COMMUNITY): Admission: RE | Admit: 2015-10-11 | Payer: Medicare Other | Source: Ambulatory Visit

## 2015-10-13 ENCOUNTER — Ambulatory Visit: Payer: Self-pay | Admitting: Internal Medicine

## 2015-10-25 ENCOUNTER — Ambulatory Visit: Payer: Medicare Other | Admitting: Cardiology

## 2015-10-31 DEATH — deceased

## 2015-11-08 ENCOUNTER — Ambulatory Visit: Payer: Medicare Other | Admitting: Cardiology

## 2016-10-11 ENCOUNTER — Encounter: Payer: Self-pay | Admitting: Internal Medicine
